# Patient Record
Sex: Female | Born: 1935 | Race: White | Hispanic: No | Marital: Married | Smoking: Never smoker
Health system: Southern US, Community
[De-identification: ages and names within clinical notes are randomized; demographics above are authoritative.]

## PROBLEM LIST (undated history)

## (undated) DIAGNOSIS — I10 Essential (primary) hypertension: Secondary | ICD-10-CM

## (undated) DIAGNOSIS — G459 Transient cerebral ischemic attack, unspecified: Secondary | ICD-10-CM

## (undated) DIAGNOSIS — F419 Anxiety disorder, unspecified: Secondary | ICD-10-CM

## (undated) DIAGNOSIS — R7881 Bacteremia: Secondary | ICD-10-CM

## (undated) DIAGNOSIS — E119 Type 2 diabetes mellitus without complications: Secondary | ICD-10-CM

## (undated) DIAGNOSIS — M1990 Unspecified osteoarthritis, unspecified site: Secondary | ICD-10-CM

## (undated) DIAGNOSIS — N390 Urinary tract infection, site not specified: Secondary | ICD-10-CM

## (undated) DIAGNOSIS — A0472 Enterocolitis due to Clostridium difficile, not specified as recurrent: Secondary | ICD-10-CM

## (undated) DIAGNOSIS — N200 Calculus of kidney: Secondary | ICD-10-CM

## (undated) HISTORY — PX: TONSILLECTOMY: SUR1361

## (undated) HISTORY — PX: PACEMAKER INSERTION: SHX728

---

## 2012-07-18 ENCOUNTER — Encounter (HOSPITAL_COMMUNITY): Payer: Self-pay | Admitting: *Deleted

## 2012-07-18 ENCOUNTER — Emergency Department (HOSPITAL_COMMUNITY): Payer: Medicare HMO

## 2012-07-18 ENCOUNTER — Inpatient Hospital Stay (HOSPITAL_COMMUNITY)
Admission: EM | Admit: 2012-07-18 | Discharge: 2012-07-21 | DRG: 690 | Disposition: A | Discharge status: Skilled Nursing Facility | Payer: Medicare HMO | Attending: Internal Medicine | Admitting: Internal Medicine

## 2012-07-18 DIAGNOSIS — G459 Transient cerebral ischemic attack, unspecified: Secondary | ICD-10-CM

## 2012-07-18 DIAGNOSIS — I639 Cerebral infarction, unspecified: Secondary | ICD-10-CM

## 2012-07-18 DIAGNOSIS — L0390 Cellulitis, unspecified: Secondary | ICD-10-CM

## 2012-07-18 DIAGNOSIS — R269 Unspecified abnormalities of gait and mobility: Secondary | ICD-10-CM | POA: Diagnosis present

## 2012-07-18 DIAGNOSIS — IMO0002 Reserved for concepts with insufficient information to code with codable children: Secondary | ICD-10-CM | POA: Diagnosis present

## 2012-07-18 DIAGNOSIS — R4701 Aphasia: Secondary | ICD-10-CM | POA: Diagnosis present

## 2012-07-18 DIAGNOSIS — E119 Type 2 diabetes mellitus without complications: Secondary | ICD-10-CM

## 2012-07-18 DIAGNOSIS — N390 Urinary tract infection, site not specified: Secondary | ICD-10-CM

## 2012-07-18 DIAGNOSIS — A498 Other bacterial infections of unspecified site: Secondary | ICD-10-CM | POA: Diagnosis present

## 2012-07-18 DIAGNOSIS — M1990 Unspecified osteoarthritis, unspecified site: Secondary | ICD-10-CM | POA: Diagnosis present

## 2012-07-18 DIAGNOSIS — I10 Essential (primary) hypertension: Secondary | ICD-10-CM | POA: Diagnosis present

## 2012-07-18 DIAGNOSIS — W010XXA Fall on same level from slipping, tripping and stumbling without subsequent striking against object, initial encounter: Secondary | ICD-10-CM | POA: Diagnosis present

## 2012-07-18 DIAGNOSIS — L02419 Cutaneous abscess of limb, unspecified: Secondary | ICD-10-CM | POA: Diagnosis present

## 2012-07-18 DIAGNOSIS — R5381 Other malaise: Secondary | ICD-10-CM | POA: Diagnosis present

## 2012-07-18 HISTORY — DX: Unspecified osteoarthritis, unspecified site: M19.90

## 2012-07-18 HISTORY — DX: Type 2 diabetes mellitus without complications: E11.9

## 2012-07-18 HISTORY — DX: Urinary tract infection, site not specified: N39.0

## 2012-07-18 LAB — TROPONIN I: Troponin I: <0.3 ng/mL (ref <0.30)

## 2012-07-18 LAB — COMPREHENSIVE METABOLIC PANEL WITH GFR
ALT: 26 U/L (ref 0–35)
AST: 34 U/L (ref 0–37)
Albumin: 3.3 g/dL — ABNORMAL LOW (ref 3.5–5.2)
Alkaline Phosphatase: 112 U/L (ref 39–117)
BUN: 26 mg/dL — ABNORMAL HIGH (ref 6–23)
CO2: 30 meq/L (ref 19–32)
Calcium: 9.8 mg/dL (ref 8.4–10.5)
Chloride: 99 meq/L (ref 96–112)
Creatinine, Ser: 0.57 mg/dL (ref 0.50–1.10)
GFR calc Af Amer: >90 mL/min
GFR calc non Af Amer: 87 mL/min — ABNORMAL LOW
Glucose, Bld: 163 mg/dL — ABNORMAL HIGH (ref 70–99)
Potassium: 3.8 meq/L (ref 3.5–5.1)
Sodium: 137 meq/L (ref 135–145)
Total Bilirubin: 0.4 mg/dL (ref 0.3–1.2)
Total Protein: 6.3 g/dL (ref 6.0–8.3)

## 2012-07-18 LAB — CBC WITH DIFFERENTIAL/PLATELET
Basophils Absolute: 0 K/uL (ref 0.0–0.1)
Basophils Relative: 0 % (ref 0–1)
Eosinophils Absolute: 0 K/uL (ref 0.0–0.7)
Eosinophils Relative: 1 % (ref 0–5)
HCT: 35.9 % — ABNORMAL LOW (ref 36.0–46.0)
Hemoglobin: 11.9 g/dL — ABNORMAL LOW (ref 12.0–15.0)
Lymphocytes Relative: 23 % (ref 12–46)
Lymphs Abs: 1.3 K/uL (ref 0.7–4.0)
MCH: 30 pg (ref 26.0–34.0)
MCHC: 33.1 g/dL (ref 30.0–36.0)
MCV: 90.4 fL (ref 78.0–100.0)
Monocytes Absolute: 0.5 K/uL (ref 0.1–1.0)
Monocytes Relative: 9 % (ref 3–12)
Neutro Abs: 4 K/uL (ref 1.7–7.7)
Neutrophils Relative %: 67 % (ref 43–77)
Platelets: 243 K/uL (ref 150–400)
RBC: 3.97 MIL/uL (ref 3.87–5.11)
RDW: 13.9 % (ref 11.5–15.5)
WBC: 5.9 K/uL (ref 4.0–10.5)

## 2012-07-18 LAB — URINALYSIS, ROUTINE W REFLEX MICROSCOPIC
Bilirubin Urine: NEGATIVE
Glucose, UA: NEGATIVE mg/dL
Nitrite: NEGATIVE
Protein, ur: 30 mg/dL — AB
Specific Gravity, Urine: 1.02 (ref 1.005–1.030)
Urobilinogen, UA: 0.2 mg/dL (ref 0.0–1.0)
pH: 8 (ref 5.0–8.0)

## 2012-07-18 LAB — URINE MICROSCOPIC-ADD ON

## 2012-07-18 LAB — GLUCOSE, CAPILLARY

## 2012-07-18 MED ORDER — VANCOMYCIN HCL IN DEXTROSE 1-5 GM/200ML-% IV SOLN
1000.0000 mg | Freq: Two times a day (BID) | INTRAVENOUS | Status: DC
  Administered 2012-07-18 – 2012-07-20 (×4): 1000 mg via INTRAVENOUS
  Filled 2012-07-18 (×6): qty 200

## 2012-07-18 MED ORDER — INSULIN ASPART 100 UNIT/ML ~~LOC~~ SOLN
0.0000 [IU] | Freq: Three times a day (TID) | SUBCUTANEOUS | Status: DC
  Administered 2012-07-19: 1 [IU] via SUBCUTANEOUS
  Administered 2012-07-19: 2 [IU] via SUBCUTANEOUS
  Administered 2012-07-20: 3 [IU] via SUBCUTANEOUS
  Administered 2012-07-20 – 2012-07-21 (×3): 1 [IU] via SUBCUTANEOUS
  Administered 2012-07-21: 2 [IU] via SUBCUTANEOUS

## 2012-07-18 MED ORDER — DEXTROSE 5 % IV SOLN
1.0000 g | INTRAVENOUS | Status: DC
  Administered 2012-07-18 – 2012-07-19 (×2): 1 g via INTRAVENOUS
  Filled 2012-07-18 (×3): qty 10

## 2012-07-18 MED ORDER — VANCOMYCIN HCL IN DEXTROSE 1-5 GM/200ML-% IV SOLN
INTRAVENOUS | Status: AC
  Filled 2012-07-18: qty 200

## 2012-07-18 MED ORDER — LORAZEPAM 2 MG/ML IJ SOLN
2.0000 mg | INTRAMUSCULAR | Status: DC | PRN
  Administered 2012-07-19: 2 mg via INTRAVENOUS
  Filled 2012-07-18 (×2): qty 1

## 2012-07-18 MED ORDER — ACETAMINOPHEN 325 MG PO TABS: 650.0000 mg | ORAL_TABLET | ORAL | Status: DC | PRN

## 2012-07-18 MED ORDER — OXYBUTYNIN CHLORIDE ER 5 MG PO TB24
15.0000 mg | ORAL_TABLET | Freq: Every evening | ORAL | Status: DC
  Administered 2012-07-18 – 2012-07-20 (×3): 15 mg via ORAL
  Filled 2012-07-18: qty 1
  Filled 2012-07-18 (×2): qty 3

## 2012-07-18 MED ORDER — ASPIRIN 325 MG PO TABS
325.0000 mg | ORAL_TABLET | Freq: Every day | ORAL | Status: DC
  Administered 2012-07-18 – 2012-07-21 (×4): 325 mg via ORAL
  Filled 2012-07-18 (×4): qty 1

## 2012-07-18 MED ORDER — DEXTROSE 5 % IV SOLN
INTRAVENOUS | Status: AC
  Filled 2012-07-18: qty 10

## 2012-07-18 MED ORDER — OXYBUTYNIN CHLORIDE ER 5 MG PO TB24
ORAL_TABLET | ORAL | Status: AC
  Filled 2012-07-18: qty 3

## 2012-07-18 MED ORDER — ENOXAPARIN SODIUM 40 MG/0.4ML ~~LOC~~ SOLN
40.0000 mg | SUBCUTANEOUS | Status: DC
  Administered 2012-07-18 – 2012-07-20 (×3): 40 mg via SUBCUTANEOUS
  Filled 2012-07-18 (×3): qty 0.4

## 2012-07-18 NOTE — ED Notes (Signed)
Got up to go to bathroom Monday at 0200 and fell.  Noticed slurred speech when sons came to help get her up.  Patient states at the time, she was having "difficulty getting her thoughts out." Family states speech has stayed the same since then. Has been unable to get up and walk since this episode.  No facial droop noted. No noticeable slurring of speech.  Also has been experiencing urinary frequency and urgency. No dysuria.

## 2012-07-18 NOTE — Progress Notes (Signed)
ANTIBIOTIC CONSULT NOTE - INITIAL  Pharmacy Consult for Vancomycin Indication: Cellulitis, left lower extremity  No Known Allergies  Patient Measurements: Height: 5\' 5"  (165.1 cm) Weight: 190 lb (86.183 kg) IBW/kg (Calculated) : 57   Vital Signs: Temp: 98 F (36.7 C) (07/08 1700) BP: 132/52 mmHg (07/08 1805) Pulse Rate: 62 (07/08 1805) Intake/Output from previous day:   Intake/Output from this shift:    Labs:  Recent Labs  07/18/12 1629  WBC 5.9  HGB 11.9*  PLT 243  CREATININE 0.57   Estimated Creatinine Clearance: 62.9 ml/min (by C-G formula based on Cr of 0.57). No results found for this basename: VANCOTROUGH, VANCOPEAK, VANCORANDOM, GENTTROUGH, GENTPEAK, GENTRANDOM, TOBRATROUGH, TOBRAPEAK, TOBRARND, AMIKACINPEAK, AMIKACINTROU, AMIKACIN,  in the last 72 hours   Microbiology: No results found for this or any previous visit (from the past 720 hour(s)).  Medical History: Past Medical History  Diagnosis Date  . Diabetes mellitus without complication   . Arthritis     Medications:  Scheduled:  . aspirin  325 mg Oral Daily  . cefTRIAXone (ROCEPHIN)  IV  1 g Intravenous Q24H  . enoxaparin (LOVENOX) injection  40 mg Subcutaneous Q24H  . [START ON 07/19/2012] insulin aspart  0-9 Units Subcutaneous TID WC  . oxybutynin  15 mg Oral QPM  . vancomycin  1,000 mg Intravenous Q12H   Assessment: Labs reviewed Cellulitis  Goal of Therapy:  Vancomycin trough level 10-15 mcg/ml  Plan:  Vancomycin 1 GM IV every 12 hours Monitor renal function Labs per protocol  Nicole Williamson, Nicole Williamson 07/18/2012,8:41 PM

## 2012-07-18 NOTE — ED Provider Notes (Signed)
History  This chart was scribed for Nicole Lennert, MD, by Candelaria Stagers, ED Scribe. This patient was seen in room APA11/AP[facility]11 and the patient's care was started at 4:15 PM  CSN: [id number] Arrival date & time 07/18/12  1513  First MD Initiated Contact with Patient 07/18/12 1611     Chief Complaint  Patient presents with  . Aphasia    Patient is a 79 y.o. female presenting with fall. The history is provided by the patient and a relative. No language interpreter was used.  Fall This is a new problem. The current episode started 2 days ago. The problem occurs constantly. The problem has not changed since onset.Associated symptoms comments: Aphasia, difficulty walking. Nothing aggravates the symptoms. Nothing relieves the symptoms. She has tried nothing for the symptoms. The treatment provided no relief.   HPI Comments: Nicole Williamson is a 79 y.o. female who presents to the Emergency Department complaining of aphasia that started yesterday after she reports she tripped and fell while going to the bathroom.  Pt has not been walking without support since the fall due to unsteadiness that became worse after the fall.  Her family reports she walked with a walker before the fall.  She has an abrasion to the left lower anterior leg.    PCP in [place], [place]  Past Medical History  Diagnosis Date  . Diabetes mellitus without complication   . Arthritis    Past Surgical History  Procedure Laterality Date  . Tonsillectomy     History reviewed. No pertinent family history. History  Substance Use Topics  . Smoking status: Never Smoker   . Smokeless tobacco: Not on file  . Alcohol Use: No   OB History   Grav Para Term Preterm Abortions TAB SAB Ect Mult Living                 Review of Systems  Neurological: Positive for speech difficulty and weakness.  All other systems reviewed and are negative.    Allergies  Review of patient's allergies indicates no known allergies.  Home  Medications  No current outpatient prescriptions on file. BP 112/45  Pulse 85  Temp(Src) 97.8 F (36.6 C)  Resp 18  Ht 5\' 5"  (1.651 m)  Wt 190 lb (86.183 kg)  BMI 31.62 kg/m2  SpO2 96% Physical Exam  Nursing note and vitals reviewed. Constitutional: She is oriented to person, place, and time. She appears well-developed and well-nourished. No distress.  HENT:  Head: Normocephalic and atraumatic.  Eyes: EOM are normal.  Neck: Neck supple. No tracheal deviation present.  Cardiovascular: Normal rate.   Pulmonary/Chest: Effort normal. No respiratory distress.  Musculoskeletal: Normal range of motion.  Neurological: She is alert and oriented to person, place, and time.  Minimal slurred speech.  Profound weakness in bilateral legs.   Skin: Skin is warm and dry.  Psychiatric: She has a normal mood and affect. Her behavior is normal.    ED Course  Procedures  DIAGNOSTIC STUDIES: Oxygen Saturation is 96% on room air, normal by my interpretation.    COORDINATION OF CARE:  4:17 PM Discussed course of care with pt.  Pt understands and agrees.   7:09 PM Recheck: Sx unchanged.  Discussed labs and images with pt and family and need for hospital admission.    Labs Reviewed  CBC WITH DIFFERENTIAL - Abnormal; Notable for the following:    Hemoglobin 11.9 (*)    HCT 35.9 (*)    All other components within  normal limits  COMPREHENSIVE METABOLIC PANEL - Abnormal; Notable for the following:    Glucose, Bld 163 (*)    BUN 26 (*)    Albumin 3.3 (*)    GFR calc non Af Amer 87 (*)    All other components within normal limits  URINALYSIS, ROUTINE W REFLEX MICROSCOPIC - Abnormal; Notable for the following:    APPearance TURBID (*)    Hgb urine dipstick SMALL (*)    Ketones, ur TRACE (*)    Protein, ur 30 (*)    Leukocytes, UA MODERATE (*)    All other components within normal limits  URINE MICROSCOPIC-ADD ON - Abnormal; Notable for the following:    Squamous Epithelial / LPF FEW (*)     Bacteria, UA MANY (*)    All other components within normal limits  URINE CULTURE  TROPONIN I   Dg Pelvis 1-2 Views  07/18/2012   *RADIOLOGY REPORT*  Clinical Data: Fall.  Weakness.  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  I see no acute fracture, subluxation or dislocation.  SI joints and hip joints are symmetric and unremarkable.  Rightward scoliosis and degenerative changes in the visualized lower lumbar spine.  IMPRESSION: No acute bony abnormality.   Original Report Authenticated By: Charlett Nose, M.D.   Ct Head Wo Contrast  07/18/2012   *RADIOLOGY REPORT*  Clinical Data:  aphasia  CT HEAD WITHOUT CONTRAST CT CERVICAL SPINE WITHOUT CONTRAST  Technique:  Multidetector CT imaging of the head and cervical spine was performed following the standard protocol without intravenous contrast.  Multiplanar CT image reconstructions of the cervical spine were also generated.  Comparison:   None  CT HEAD  Findings: There is diffuse patchy low density throughout the subcortical and periventricular white matter consistent with chronic small vessel ischemic change.  There is prominence of the sulci and ventricles consistent with brain atrophy.  The paranasal sinuses and the mastoid air cells are clear.  The skull is intact.  IMPRESSION:  1.  Small vessel ischemic change and brain atrophy.  CT CERVICAL SPINE  Findings: Normal alignment of the cervical spine.  The vertebral body heights and disc spaces are well preserved.  No fracture or subluxation identified.Lung apices appear clear.  IMPRESSION:  No acute findings.   Original Report Authenticated By: Signa Kell, M.D.   Ct Cervical Spine Wo Contrast  07/18/2012   *RADIOLOGY REPORT*  Clinical Data:  aphasia  CT HEAD WITHOUT CONTRAST CT CERVICAL SPINE WITHOUT CONTRAST  Technique:  Multidetector CT imaging of the head and cervical spine  was performed following the standard protocol without intravenous contrast.  Multiplanar CT image reconstructions of the cervical spine were also generated.  Comparison:   None  CT HEAD  Findings: There is diffuse patchy low density throughout the subcortical and periventricular white matter consistent with chronic small vessel ischemic change.  There is prominence of the sulci and ventricles consistent with brain atrophy.  The paranasal sinuses and the mastoid air cells are clear.  The skull is intact.  IMPRESSION:  1.  Small vessel ischemic change and brain atrophy.  CT CERVICAL SPINE  Findings: Normal alignment of the cervical spine.  The vertebral body heights and disc spaces are well preserved.  No fracture or subluxation identified.Lung apices appear clear.  IMPRESSION:  No acute findings.   Original Report Authenticated By: Signa Kell, M.D.  No diagnosis found.  MDM   Uti,  Possible stroke.          The chart was scribed for me under my direct supervision.  I personally performed the history, physical, and medical decision making and all procedures in the evaluation of this patient.Nicole Lennert, MD 07/18/12 [phone]

## 2012-07-18 NOTE — H&P (Signed)
PCP:   No primary provider on file.   Chief Complaint:  Weakness after fall  HPI: 79 year old female who came to the ED today after patient was having difficulty walking and also difficulty finding words. Patient's symptoms started 2 days ago when she fell and had difficulty walking since then. As per patient she was noticed to have dragging her left leg, and also had some aphasia and difficulty finding words. She does not have a history of stroke in the past, patient was being treated for UTI. At this time most of her symptoms are resolved. She denies seizure, no chest pain shortness of breath, no nausea vomiting or diarrhea. She denies fever but admits to having some urgency and increased frequency of urination Allergies:  No Known Allergies    Past Medical History  Diagnosis Date  . Diabetes mellitus without complication   . Arthritis     Past Surgical History  Procedure Laterality Date  . Tonsillectomy      Prior to Admission medications   Medication Sig Start Date End Date Taking? Authorizing Provider  aspirin EC 81 MG tablet Take 81 mg by mouth daily.   Yes Historical Provider, MD  calcium carbonate (TUMS - DOSED IN MG ELEMENTAL CALCIUM) 500 MG chewable tablet Chew 1 tablet by mouth daily as needed for heartburn.   Yes Historical Provider, MD  lisinopril (PRINIVIL,ZESTRIL) 10 MG tablet Take 10 mg by mouth daily.   Yes Historical Provider, MD  oxybutynin (DITROPAN XL) 15 MG 24 hr tablet Take 15 mg by mouth every evening.   Yes Historical Provider, MD  pioglitazone (ACTOS) 30 MG tablet Take 30 mg by mouth every evening. Taken with evening meal   Yes Historical Provider, MD    Social History:  reports that she has never smoked. She does not have any smokeless tobacco history on file. She reports that she does not drink alcohol or use illicit drugs.  History reviewed. No pertinent family history.   All the positives are listed in BOLD  Review of Systems:  HEENT: Headache,  blurred vision, runny nose, sore throat Neck: Hypothyroidism, hyperthyroidism,,lymphadenopathy Chest : Shortness of breath, history of COPD, Asthma Heart : Chest pain, history of coronary arterey disease GI:  Nausea, vomiting, diarrhea, constipation, GERD GU: Dysuria, urgency, frequency of urination, hematuria Neuro: Stroke, seizures, syncope Psych: Depression, anxiety, hallucinations   Physical Exam: Blood pressure 132/52, pulse 62, temperature 98 F (36.7 C), resp. rate 18, height 5\' 5"  (1.651 m), weight 86.183 kg (190 lb), SpO2 99.00%. Constitutional:   Patient is a well-developed and well-nourished female* in no acute distress and cooperative with exam. Head: Normocephalic and atraumatic Mouth: Mucus membranes moist Eyes: PERRL, EOMI, conjunctivae normal Neck: Supple, No Thyromegaly Cardiovascular: RRR, S1 normal, S2 normal Pulmonary/Chest: CTAB, no wheezes, rales, or rhonchi Abdominal: Soft. Non-tender, non-distended, bowel sounds are normal, no masses, organomegaly, or guarding present.  Neurological: A&O x3, Strenght is normal and symmetric bilaterally, cranial nerve II-XII are grossly intact, no focal motor deficit, sensory intact to light touch bilaterally.  Extremities : Positive erythema, warmth noted in the left lower extremity, small skin breakdown noted in the lateral aspect of the left lower extremity   Labs on Admission:  Results for orders placed during the hospital encounter of 07/18/12 (from the past 48 hour(s))  CBC WITH DIFFERENTIAL     Status: Abnormal   Collection Time    07/18/12  4:29 PM      Result Value Range   WBC 5.9  4.0 - 10.5 K/uL   RBC 3.97  3.87 - 5.11 MIL/uL   Hemoglobin 11.9 (*) 12.0 - 15.0 g/dL   HCT 16.1 (*) 09.6 - 04.5 %   MCV 90.4  78.0 - 100.0 fL   MCH 30.0  26.0 - 34.0 pg   MCHC 33.1  30.0 - 36.0 g/dL   RDW 40.9  81.1 - 91.4 %   Platelets 243  150 - 400 K/uL   Neutrophils Relative % 67  43 - 77 %   Neutro Abs 4.0  1.7 - 7.7 K/uL    Lymphocytes Relative 23  12 - 46 %   Lymphs Abs 1.3  0.7 - 4.0 K/uL   Monocytes Relative 9  3 - 12 %   Monocytes Absolute 0.5  0.1 - 1.0 K/uL   Eosinophils Relative 1  0 - 5 %   Eosinophils Absolute 0.0  0.0 - 0.7 K/uL   Basophils Relative 0  0 - 1 %   Basophils Absolute 0.0  0.0 - 0.1 K/uL  COMPREHENSIVE METABOLIC PANEL     Status: Abnormal   Collection Time    07/18/12  4:29 PM      Result Value Range   Sodium 137  135 - 145 mEq/L   Potassium 3.8  3.5 - 5.1 mEq/L   Chloride 99  96 - 112 mEq/L   CO2 30  19 - 32 mEq/L   Glucose, Bld 163 (*) 70 - 99 mg/dL   BUN 26 (*) 6 - 23 mg/dL   Creatinine, Ser 7.82  0.50 - 1.10 mg/dL   Calcium 9.8  8.4 - 95.6 mg/dL   Total Protein 6.3  6.0 - 8.3 g/dL   Albumin 3.3 (*) 3.5 - 5.2 g/dL   AST 34  0 - 37 U/L   ALT 26  0 - 35 U/L   Alkaline Phosphatase 112  39 - 117 U/L   Total Bilirubin 0.4  0.3 - 1.2 mg/dL   GFR calc non Af Amer 87 (*) >90 mL/min   GFR calc Af Amer >90  >90 mL/min   Comment:            The eGFR has been calculated     using the CKD EPI equation.     This calculation has not been     validated in all clinical     situations.     eGFR's persistently     <90 mL/min signify     possible Chronic Kidney Disease.  TROPONIN I     Status: None   Collection Time    07/18/12  4:29 PM      Result Value Range   Troponin I <0.30  <0.30 ng/mL   Comment:            Due to the release kinetics of cTnI,     a negative result within the first hours     of the onset of symptoms does not rule out     myocardial infarction with certainty.     If myocardial infarction is still suspected,     repeat the test at appropriate intervals.  URINALYSIS, ROUTINE W REFLEX MICROSCOPIC     Status: Abnormal   Collection Time    07/18/12  5:00 PM      Result Value Range   Color, Urine YELLOW  YELLOW   APPearance TURBID (*) CLEAR   Specific Gravity, Urine 1.020  1.005 - 1.030   pH 8.0  5.0 -  8.0   Glucose, UA NEGATIVE  NEGATIVE mg/dL   Hgb urine  dipstick SMALL (*) NEGATIVE   Bilirubin Urine NEGATIVE  NEGATIVE   Ketones, ur TRACE (*) NEGATIVE mg/dL   Protein, ur 30 (*) NEGATIVE mg/dL   Urobilinogen, UA 0.2  0.0 - 1.0 mg/dL   Nitrite NEGATIVE  NEGATIVE   Leukocytes, UA MODERATE (*) NEGATIVE  URINE MICROSCOPIC-ADD ON     Status: Abnormal   Collection Time    07/18/12  5:00 PM      Result Value Range   Squamous Epithelial / LPF FEW (*) RARE   WBC, UA TOO NUMEROUS TO COUNT  <3 WBC/hpf   Bacteria, UA MANY (*) RARE    Radiological Exams on Admission: Dg Pelvis 1-2 Views  07/18/2012   *RADIOLOGY REPORT*  Clinical Data: Fall.  Weakness.  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  I see no acute fracture, subluxation or dislocation.  SI joints and hip joints are symmetric and unremarkable.  Rightward scoliosis and degenerative changes in the visualized lower lumbar spine.  IMPRESSION: No acute bony abnormality.   Original Report Authenticated By: Charlett Nose, M.D.   Ct Head Wo Contrast  07/18/2012   *RADIOLOGY REPORT*  Clinical Data:  aphasia  CT HEAD WITHOUT CONTRAST CT CERVICAL SPINE WITHOUT CONTRAST  Technique:  Multidetector CT imaging of the head and cervical spine was performed following the standard protocol without intravenous contrast.  Multiplanar CT image reconstructions of the cervical spine were also generated.  Comparison:   None  CT HEAD  Findings: There is diffuse patchy low density throughout the subcortical and periventricular white matter consistent with chronic small vessel ischemic change.  There is prominence of the sulci and ventricles consistent with brain atrophy.  The paranasal sinuses and the mastoid air cells are clear.  The skull is intact.  IMPRESSION:  1.  Small vessel ischemic change and brain atrophy.  CT CERVICAL SPINE  Findings: Normal alignment of the cervical spine.  The vertebral  body heights and disc spaces are well preserved.  No fracture or subluxation identified.Lung apices appear clear.  IMPRESSION:  No acute findings.   Original Report Authenticated By: Signa Kell, M.D.   Ct Cervical Spine Wo Contrast  07/18/2012   *RADIOLOGY REPORT*  Clinical Data:  aphasia  CT HEAD WITHOUT CONTRAST CT CERVICAL SPINE WITHOUT CONTRAST  Technique:  Multidetector CT imaging of the head and cervical spine was performed following the standard protocol without intravenous contrast.  Multiplanar CT image reconstructions of the cervical spine were also generated.  Comparison:   None  CT HEAD  Findings: There is diffuse patchy low density throughout the subcortical and periventricular white matter consistent with chronic small vessel ischemic change.  There is prominence of the sulci and ventricles consistent with brain atrophy.  The paranasal sinuses and the mastoid air cells are clear.  The skull is intact.  IMPRESSION:  1.  Small vessel ischemic change and brain atrophy.  CT CERVICAL SPINE  Findings: Normal alignment of the cervical spine.  The vertebral body heights and disc spaces are well preserved.  No fracture or subluxation identified.Lung apices appear clear.  IMPRESSION:  No acute findings.   Original Report Authenticated By: Signa Kell, M.D.    Assessment/Plan Active Problems:   TIA (transient ischemic attack)   UTI (lower urinary tract infection)   Cellulitis  Diabetes mellitus  TIA Patient had symptoms of weakness and aphasia which have now resolved Will admit the patient for workup for TIA versus stroke We'll obtain MRA of the brain We'll give patient Ativan when necessary for claustrophobia during the MRI Obtain hemoglobin A1c, lipid panel in the morning  UTI Patient's UA is abnormal We'll start Rocephin Await urine culture results  Cellulitis Patient has left lower extremity cellulitis We'll start vancomycin per pharmacy consult Once patient starts improving  she can be switched over to by mouth antibiotics  Diabetes mellitus We'll start sliding scale insulin Will hold the pioglitazone at this time  Hypertension Hold lisinopril for permissive hypertension  Weakness Will get PT OT consult   Code status: Full code  Family discussion: Discussed with daughter-in-law at bedside   Time Spent on Admission: 60 min  Malaysha Arlen S [facility] Pager: [phone] 07/18/2012, 8:10 PM  If 7PM-7AM, please contact night-coverage  [email or web address]  Password [email or web address]

## 2012-07-19 ENCOUNTER — Inpatient Hospital Stay (HOSPITAL_COMMUNITY): Payer: Medicare HMO

## 2012-07-19 DIAGNOSIS — I517 Cardiomegaly: Secondary | ICD-10-CM

## 2012-07-19 LAB — RAPID URINE DRUG SCREEN, HOSP PERFORMED
Amphetamines: NOT DETECTED
Benzodiazepines: NOT DETECTED
Opiates: NOT DETECTED
Tetrahydrocannabinol: NOT DETECTED

## 2012-07-19 LAB — GLUCOSE, CAPILLARY
Glucose-Capillary: 132 mg/dL — ABNORMAL HIGH (ref 70–99)
Glucose-Capillary: 150 mg/dL — ABNORMAL HIGH (ref 70–99)

## 2012-07-19 LAB — LIPID PANEL
HDL: 61 mg/dL (ref >39)
Total CHOL/HDL Ratio: 2.3 RATIO

## 2012-07-19 LAB — HEMOGLOBIN A1C: Mean Plasma Glucose: 137 mg/dL — ABNORMAL HIGH (ref <117)

## 2012-07-19 NOTE — Care Management Note (Signed)
    Page 1 of 1   07/21/2012     12:03:17 PM   CARE MANAGEMENT NOTE 07/21/2012  Patient:  Nicole Williamson, Nicole Williamson   Account Number:  [id number]  Date Initiated:  07/19/2012  Documentation initiated by:  Rosemary Holms  Subjective/Objective Assessment:   Pt admitted from home where she lives with spouse. States since her fall she has not been able to stand. Notices her speech is slurred and has a problem finding the right words.     Action/Plan:   Spoke about HH versus placement for rehab. Concerned over cost.   Anticipated DC Date:  07/21/2012   Anticipated DC Plan:  SKILLED NURSING FACILITY      DC Planning Services  CM consult      Choice offered to / List presented to:             Status of service:  Completed, signed off Medicare Important Message given?   (If response is "NO", the following Medicare IM given date fields will be blank) Date Medicare IM given:   Date Additional Medicare IM given:    Discharge Disposition:  SKILLED NURSING FACILITY  Per UR Regulation:    If discussed at Long Length of Stay Meetings, dates discussed:    Comments:  07/19/12 Rosemary Holms RN BSN CM

## 2012-07-19 NOTE — Clinical Social Work Note (Signed)
Referral for advance directives. Spoke with chaplain who plans to discuss with pt today. CSW signing off but can be reconsulted if needed.   Derenda Fennel, [place] [phone]

## 2012-07-19 NOTE — Progress Notes (Signed)
OT Cancellation Note  Patient Details Name: Nicole Williamson MRN: [id number] DOB: 1931/02/27   Cancelled Treatment:    Reason Eval/Treat Not Completed: Patient declined. Attempted OT evaluation. Patient refused to participated as she was feeling extremely anxious waiting all morning to go the MRI. Will attempt OT eval this afternoon if able.   Limmie Patricia, OTR/L,CBIS   07/19/2012, 11:33 AM

## 2012-07-19 NOTE — Progress Notes (Signed)
Utilization Review Complete  

## 2012-07-19 NOTE — Progress Notes (Signed)
Nicole Williamson EXB:[id number] DOB: March 18, 1931 DOA: 07/18/2012 PCP: No primary provider on file.   Subjective: This lady fell approximately 36 hours ago at home and could not get up very easily. It seems that she was unsteady on her feet. Also her speech was slurred at the time and she could not find the words that she wanted to say. Her symptoms are somewhat improved but she still feels unsteady. She has sustained inflammation and probable infection of the site of the fall in the left lower leg. She also has a UTI. She is awaiting MRI brain scan.           Physical Exam: Blood pressure 106/61, pulse 57, temperature 98.3 F (36.8 C), temperature source Oral, resp. rate 20, height 5\' 5"  (1.651 m), weight 71.9 kg (158 lb 8.2 oz), SpO2 96.00%. She is alert and orientated. There are no obvious focal neurological signs. She does not have any obvious cerebellar signs either. She does have left lower leg cellulitis. There are no carotid bruits. She is in sinus rhythm. There are no murmurs. Lung fields are clear.   Investigations:  No results found for this or any previous visit (from the past 240 hour(s)).   Basic Metabolic Panel:  Recent Labs  11/07/23 1629  NA 137  K 3.8  CL 99  CO2 30  GLUCOSE 163*  BUN 26*  CREATININE 0.57  CALCIUM 9.8   Liver Function Tests:  Recent Labs  07/18/12 1629  AST 34  ALT 26  ALKPHOS 112  BILITOT 0.4  PROT 6.3  ALBUMIN 3.3*     CBC:  Recent Labs  07/18/12 1629  WBC 5.9  NEUTROABS 4.0  HGB 11.9*  HCT 35.9*  MCV 90.4  PLT 243    Dg Pelvis 1-2 Views  07/18/2012   *RADIOLOGY REPORT*  Clinical Data: Fall.  Weakness.  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  I see no acute fracture, subluxation or dislocation.  SI joints and hip joints are symmetric and unremarkable.  Rightward scoliosis and degenerative  changes in the visualized lower lumbar spine.  IMPRESSION: No acute bony abnormality.   Original Report Authenticated By: Charlett Nose, M.D.   Ct Head Wo Contrast  07/18/2012   *RADIOLOGY REPORT*  Clinical Data:  aphasia  CT HEAD WITHOUT CONTRAST CT CERVICAL SPINE WITHOUT CONTRAST  Technique:  Multidetector CT imaging of the head and cervical spine was performed following the standard protocol without intravenous contrast.  Multiplanar CT image reconstructions of the cervical spine were also generated.  Comparison:   None  CT HEAD  Findings: There is diffuse patchy low density throughout the subcortical and periventricular white matter consistent with chronic small vessel ischemic change.  There is prominence of the sulci and ventricles consistent with brain atrophy.  The paranasal sinuses and the mastoid air cells are clear.  The skull is intact.  IMPRESSION:  1.  Small vessel ischemic change and brain atrophy.  CT CERVICAL SPINE  Findings: Normal alignment of the cervical spine.  The vertebral body heights and disc spaces are well preserved.  No fracture or subluxation identified.Lung apices appear clear.  IMPRESSION:  No acute findings.   Original Report Authenticated By: Signa Kell, M.D.   Ct Cervical Spine Wo Contrast  07/18/2012   *RADIOLOGY REPORT*  Clinical Data:  aphasia  CT HEAD WITHOUT CONTRAST CT CERVICAL SPINE WITHOUT CONTRAST  Technique:  Multidetector CT imaging of the head and cervical spine was performed following the standard protocol without intravenous contrast.  Multiplanar CT image reconstructions of the cervical spine were also generated.  Comparison:   None  CT HEAD  Findings: There is diffuse patchy low density throughout the subcortical and periventricular white matter consistent with chronic small vessel ischemic change.  There is prominence of the sulci and ventricles consistent with brain atrophy.  The paranasal sinuses and the mastoid air cells are clear.  The skull is intact.   IMPRESSION:  1.  Small vessel ischemic change and brain atrophy.  CT CERVICAL SPINE  Findings: Normal alignment of the cervical spine.  The vertebral body heights and disc spaces are well preserved.  No fracture or subluxation identified.Lung apices appear clear.  IMPRESSION:  No acute findings.   Original Report Authenticated By: Signa Kell, M.D.      Medications: I have reviewed the patient's current medications.  Impression: 1. TIA versus CVA, possibly affecting the cerebellar area. 2. UTI. 3. Left lower leg cellulitis. 4. Diabetes mellitus.     Plan: 1. Await MRI brain scan. 2. Neurology consultation. 3. Physical therapy evaluation.  Consultants:  Await neurology consultation.   Procedures:  Echocardiogram: ------------------------------------------------------------ Study Conclusions  - Left ventricle: The cavity size was normal. There was mild concentric hypertrophy. Systolic function was vigorous. The estimated ejection fraction was in the range of 65% to 70%. Wall motion was normal; there were no regional wall motion abnormalities. Doppler parameters are consistent with abnormal left ventricular relaxation (grade 1 diastolic dysfunction). Doppler parameters are consistent with elevated mean left atrial filling pressure. - Aortic valve: Poorly visualized. Mildly calcified annulus. Mildly calcified leaflets. No significant regurgitation. - Mitral valve: Severely calcified annulus, particularly posterior.Mildly thickened leaflets, possible mild prolpase of portion of anterior leaflet - not well seen. Thickening of the subvalvular apparatus. There was no evidence for stenosis. Trivial regurgitation. Peak gradient: 3mm Hg (D). - Left atrium: The atrium was moderately dilated. - Right atrium: Central venous pressure: 8mm Hg (est). - Atrial septum: No defect or patent foramen ovale was identified based on limited images. - Tricuspid valve: Trivial  regurgitation. - Pulmonary arteries: Systolic pressure could not be accurately estimated. - Pericardium, extracardiac: There was no pericardial effusion. Impressions:  - No prior study for comparison. Mild LVH with LVEF 65-70%, grade 1 diastolic dysfunction with increased filling pressures. Moderate left atrial enlargement. Severe MAC with thickened mitral leaflets, possible mild prolapse of portion of anterior leaflet, but trivial mitral regurgitation. Aortic valve mildly calcfied but not well seen. No obvious PFO or ASD. Trivial tricuspid regurgitation.Cannot assess PASP, CVP estimated at 8 mmHg. Transthoracic echocardiography. M-mode, complete 2D, spectral Doppler, and color Doppler. Height: Height: 165.1cm. Height: 65in. Weight: Weight: 71.7kg. Weight: 157.7lb. Body mass index: BMI: 26.3kg/m^2. Body surface area: BSA: 1.42m^2. Patient status: Inpatient. Location: Bedside.   Antibiotics:  Intravenous Rocephin started 07/18/2012.  Intravenous vancomycin started 07/18/2012.                   Code Status: Full code.  Family Communication: Discussed plan with patient and husband at the bedside.   Disposition Plan: Depending on progress.  Time spent: 20 minutes.   LOS: 1 day   Wilson Singer Pager [phone]  07/19/2012, 12:17 PM

## 2012-07-19 NOTE — Progress Notes (Signed)
*  PRELIMINARY RESULTS* Echocardiogram 2D Echocardiogram has been performed.  Nicole Williamson 07/19/2012, 9:40 AM

## 2012-07-19 NOTE — Progress Notes (Signed)
Brought and discussed Advance Directives packet for patient to review due to her request.  Will follow up after she has reviewed.  Spent time also listening/discussing her anxiety about what happened to her as she waited for tests. Family was attentive and present. Prayed with her.

## 2012-07-20 LAB — BASIC METABOLIC PANEL
CO2: 28 mEq/L (ref 19–32)
Chloride: 103 mEq/L (ref 96–112)
Creatinine, Ser: 0.4 mg/dL — ABNORMAL LOW (ref 0.50–1.10)
GFR calc Af Amer: >90 mL/min (ref >90)
Potassium: 3.5 mEq/L (ref 3.5–5.1)
Sodium: 137 mEq/L (ref 135–145)

## 2012-07-20 LAB — GLUCOSE, CAPILLARY
Glucose-Capillary: 128 mg/dL — ABNORMAL HIGH (ref 70–99)
Glucose-Capillary: 153 mg/dL — ABNORMAL HIGH (ref 70–99)

## 2012-07-20 MED ORDER — LEVOFLOXACIN IN D5W 500 MG/100ML IV SOLN
500.0000 mg | INTRAVENOUS | Status: DC
  Administered 2012-07-20: 500 mg via INTRAVENOUS
  Filled 2012-07-20 (×2): qty 100

## 2012-07-20 MED ORDER — PIOGLITAZONE HCL 30 MG PO TABS
30.0000 mg | ORAL_TABLET | Freq: Every evening | ORAL | Status: DC
  Administered 2012-07-20: 30 mg via ORAL
  Filled 2012-07-20: qty 1

## 2012-07-20 MED ORDER — ALUM & MAG HYDROXIDE-SIMETH 200-200-20 MG/5ML PO SUSP
15.0000 mL | Freq: Four times a day (QID) | ORAL | Status: DC | PRN
  Administered 2012-07-20: 15 mL via ORAL
  Filled 2012-07-20: qty 30

## 2012-07-20 NOTE — Consult Note (Signed)
[facility] Nicole Williamson A. Gerilyn Pilgrim, MD     [email or web address]          Nicole Williamson is an 79 y.o. female.   ASSESSMENT/PLAN: 1. Multifactorial gait impairment. Differential diagnosis includes marked osteoarthritis, urinary tract infection and aging. Recommended physical therapy in a skilled facility.  2. Episodic spells of word finding difficulties of unclear etiology. EEG is recommended.  The patient is a 79 year old white female who apparently fell a few days ago. She did not trip over anything. She simply lost her balance. She apparently was caused from the restroom when this happened. It appears that she has had difficulties ambulating since then. She essentially has been in bed. She previously ambulated fairly well and was able to do her ADLs. The patient did not lose consciousness. She fell and sustained injuries to the left leg and also the low back area. She did not sustain any head injuries. She has had spells of word finding difficulties however. The husband also reports that the patient seemed to gotten confused while she was at home thinking that she was in the restroom. However, she was in the kitchen trying to use the restroom. The patient herself does not report focal weakness, numbness or headaches. There is no dizziness, shortness of breath or chest pain. She does complain of some left leg pain where she injured the leg during her fall.  GENERAL: This very pleasant average weight lady in no acute distress.  HEENT: Unremarkable.  ABDOMEN: soft  EXTREMITIES: No edema. There areas of superficial bruising left leg that seems to be healing well. There is rather severe arthritic changes of the knees bilaterally. There is also marked arthritic changes of the joints of the hands.  BACK: No evidence of injury or point tenderness involving the entire spine.  SKIN: Normal by inspection.    MENTAL STATUS: Alert and oriented. Speech, language and cognition are generally  intact. Judgment and insight normal.   CRANIAL NERVES: Pupils are equal, round and reactive to light and accommodation; extra ocular movements are full, there is no significant nystagmus; visual fields are full; upper and lower facial muscles are normal in strength and symmetric, there is no flattening of the nasolabial folds; tongue is midline; uvula is midline; shoulder elevation is normal.  MOTOR: There is mild weakness of deltoid 4+/5 on the right. Otherwise muscle strength is good in the upper extremities. Normal tone, bulk and strength in the legs; no pronator drift.  COORDINATION: Left finger to nose is normal, right finger to nose is normal, No rest tremor; no intention tremor; no postural tremor; no bradykinesia.  REFLEXES: Deep tendon reflexes are symmetrical and normal. Plantar responses are flexor bilaterally.   SENSATION: Normal to light touch.    Past Medical History  Diagnosis Date  . Diabetes mellitus without complication   . Arthritis     Past Surgical History  Procedure Laterality Date  . Tonsillectomy      History reviewed. No pertinent family history.  Social History:  reports that she has never smoked. She does not have any smokeless tobacco history on file. She reports that she does not drink alcohol or use illicit drugs.  Allergies: No Known Allergies  Medications: Prior to Admission medications   Medication Sig Start Date End Date Taking? Authorizing Provider  aspirin EC 81 MG tablet Take 81 mg by mouth daily.   Yes Historical Provider, MD  calcium carbonate (TUMS - DOSED IN MG ELEMENTAL CALCIUM) 500 MG chewable tablet Chew 1 tablet  by mouth daily as needed for heartburn.   Yes Historical Provider, MD  lisinopril (PRINIVIL,ZESTRIL) 10 MG tablet Take 10 mg by mouth daily.   Yes Historical Provider, MD  oxybutynin (DITROPAN XL) 15 MG 24 hr tablet Take 15 mg by mouth every evening.   Yes Historical Provider, MD  pioglitazone (ACTOS) 30 MG tablet Take 30 mg  by mouth every evening. Taken with evening meal   Yes Historical Provider, MD    Scheduled Meds: . aspirin  325 mg Oral Daily  . enoxaparin (LOVENOX) injection  40 mg Subcutaneous Q24H  . insulin aspart  0-9 Units Subcutaneous TID WC  . levofloxacin (LEVAQUIN) IV  500 mg Intravenous Q24H  . oxybutynin  15 mg Oral QPM  . pioglitazone  30 mg Oral QPM   Continuous Infusions:  PRN Meds:.acetaminophen, alum & mag hydroxide-simeth, LORazepam  Blood pressure 117/68, pulse 67, temperature 98.6 F (37 C), temperature source Oral, resp. rate 20, height 5\' 5"  (1.651 m), weight 71.9 kg (158 lb 8.2 oz), SpO2 97.00%.   Results for orders placed during the hospital encounter of 07/18/12 (from the past 48 hour(s))  HEMOGLOBIN A1C     Status: Abnormal   Collection Time    07/18/12  8:25 PM      Result Value Range   Hemoglobin A1C 6.4 (*) <5.7 %   Comment: (NOTE)                                                                               According to the ADA Clinical Practice Recommendations for 2011, when     HbA1c is used as a screening test:      >=6.5%   Diagnostic of Diabetes Mellitus               (if abnormal result is confirmed)     5.7-6.4%   Increased risk of developing Diabetes Mellitus     References:Diagnosis and Classification of Diabetes Mellitus,Diabetes     Care,2011,34(Suppl 1):S62-S69 and Standards of Medical Care in             Diabetes - 2011,Diabetes Care,2011,34 (Suppl 1):S11-S61.   Mean Plasma Glucose 137 (*) <117 mg/dL  GLUCOSE, CAPILLARY     Status: Abnormal   Collection Time    07/18/12  9:55 PM      Result Value Range   Glucose-Capillary 127 (*) 70 - 99 mg/dL   Comment 1 Notify RN     Comment 2 Documented in Chart    URINE RAPID DRUG SCREEN (HOSP PERFORMED)     Status: None   Collection Time    07/19/12  1:05 AM      Result Value Range   Opiates NONE DETECTED  NONE DETECTED   Cocaine NONE DETECTED  NONE DETECTED   Benzodiazepines NONE DETECTED  NONE  DETECTED   Amphetamines NONE DETECTED  NONE DETECTED   Tetrahydrocannabinol NONE DETECTED  NONE DETECTED   Barbiturates NONE DETECTED  NONE DETECTED   Comment:            DRUG SCREEN FOR MEDICAL PURPOSES     ONLY.  IF CONFIRMATION IS NEEDED     FOR ANY PURPOSE, NOTIFY LAB  WITHIN 5 DAYS.                LOWEST DETECTABLE LIMITS     FOR URINE DRUG SCREEN     Drug Class       Cutoff (ng/mL)     Amphetamine      1000     Barbiturate      200     Benzodiazepine   200     Tricyclics       300     Opiates          300     Cocaine          300     THC              50  LIPID PANEL     Status: None   Collection Time    07/19/12  5:49 AM      Result Value Range   Cholesterol 138  0 - 200 mg/dL   Triglycerides 92  <161 mg/dL   HDL 61  >09 mg/dL   Total CHOL/HDL Ratio 2.3     VLDL 18  0 - 40 mg/dL   LDL Cholesterol 59  0 - 99 mg/dL   Comment:            Total Cholesterol/HDL:CHD Risk     Coronary Heart Disease Risk Table                         Men   Women      1/2 Average Risk   3.4   3.3      Average Risk       5.0   4.4      2 X Average Risk   9.6   7.1      3 X Average Risk  23.4   11.0                Use the calculated Patient Ratio     above and the CHD Risk Table     to determine the patient's CHD Risk.                ATP III CLASSIFICATION (LDL):      <100     mg/dL   Optimal      604-540  mg/dL   Near or Above                        Optimal      130-159  mg/dL   Borderline      981-191  mg/dL   High      >478     mg/dL   Very High  GLUCOSE, CAPILLARY     Status: Abnormal   Collection Time    07/19/12  7:19 AM      Result Value Range   Glucose-Capillary 118 (*) 70 - 99 mg/dL   Comment 1 Documented in Chart     Comment 2 Notify RN    GLUCOSE, CAPILLARY     Status: Abnormal   Collection Time    07/19/12 11:24 AM      Result Value Range   Glucose-Capillary 174 (*) 70 - 99 mg/dL   Comment 1 Documented in Chart     Comment 2 Notify RN    GLUCOSE, CAPILLARY      Status: Abnormal   Collection Time    07/19/12  6:15  PM      Result Value Range   Glucose-Capillary 132 (*) 70 - 99 mg/dL  GLUCOSE, CAPILLARY     Status: Abnormal   Collection Time    07/19/12  9:15 PM      Result Value Range   Glucose-Capillary 150 (*) 70 - 99 mg/dL   Comment 1 Notify RN     Comment 2 Documented in Chart    BASIC METABOLIC PANEL     Status: Abnormal   Collection Time    07/20/12  5:56 AM      Result Value Range   Sodium 137  135 - 145 mEq/L   Potassium 3.5  3.5 - 5.1 mEq/L   Chloride 103  96 - 112 mEq/L   CO2 28  19 - 32 mEq/L   Glucose, Bld 145 (*) 70 - 99 mg/dL   BUN 12  6 - 23 mg/dL   Comment: DELTA CHECK NOTED   Creatinine, Ser 0.40 (*) 0.50 - 1.10 mg/dL   Calcium 8.8  8.4 - 19.1 mg/dL   GFR calc non Af Amer >90  >90 mL/min   GFR calc Af Amer >90  >90 mL/min   Comment:            The eGFR has been calculated     using the CKD EPI equation.     This calculation has not been     validated in all clinical     situations.     eGFR's persistently     <90 mL/min signify     possible Chronic Kidney Disease.  GLUCOSE, CAPILLARY     Status: Abnormal   Collection Time    07/20/12  7:11 AM      Result Value Range   Glucose-Capillary 137 (*) 70 - 99 mg/dL   Comment 1 Documented in Chart     Comment 2 Notify RN    GLUCOSE, CAPILLARY     Status: Abnormal   Collection Time    07/20/12 11:32 AM      Result Value Range   Glucose-Capillary 204 (*) 70 - 99 mg/dL   Comment 1 Documented in Chart     Comment 2 Notify RN    GLUCOSE, CAPILLARY     Status: Abnormal   Collection Time    07/20/12  4:41 PM      Result Value Range   Glucose-Capillary 128 (*) 70 - 99 mg/dL   Comment 1 Documented in Chart     Comment 2 Notify RN      Dg Pelvis 1-2 Views  07/18/2012   *RADIOLOGY REPORT*  Clinical Data: Fall.  Weakness.  PELVIS - 1-2 VIEW  Comparison: None  Findings: Large bony density medial to the proximal right femur, likely heterotopic bone or myositis ossificans  related to prior injury.  I see no acute fracture, subluxation or dislocation.  SI joints and hip joints are symmetric and unremarkable.  Rightward scoliosis and degenerative changes in the visualized lower lumbar spine.  IMPRESSION: No acute bony abnormality.   Original Report Authenticated By: Charlett Nose, M.D.   Ct Head Wo Contrast  07/18/2012   *RADIOLOGY REPORT*  Clinical Data:  aphasia  CT HEAD WITHOUT CONTRAST CT CERVICAL SPINE WITHOUT CONTRAST  Technique:  Multidetector CT imaging of the head and cervical spine was performed following the standard protocol without intravenous contrast.  Multiplanar CT image reconstructions of the cervical spine were also generated.  Comparison:   None  CT HEAD  Findings: There  is diffuse patchy low density throughout the subcortical and periventricular white matter consistent with chronic small vessel ischemic change.  There is prominence of the sulci and ventricles consistent with brain atrophy.  The paranasal sinuses and the mastoid air cells are clear.  The skull is intact.  IMPRESSION:  1.  Small vessel ischemic change and brain atrophy.  CT CERVICAL SPINE  Findings: Normal alignment of the cervical spine.  The vertebral body heights and disc spaces are well preserved.  No fracture or subluxation identified.Lung apices appear clear.  IMPRESSION:  No acute findings.   Original Report Authenticated By: Signa Kell, M.D.   Ct Cervical Spine Wo Contrast  07/18/2012   *RADIOLOGY REPORT*  Clinical Data:  aphasia  CT HEAD WITHOUT CONTRAST CT CERVICAL SPINE WITHOUT CONTRAST  Technique:  Multidetector CT imaging of the head and cervical spine was performed following the standard protocol without intravenous contrast.  Multiplanar CT image reconstructions of the cervical spine were also generated.  Comparison:   None  CT HEAD  Findings: There is diffuse patchy low density throughout the subcortical and periventricular white matter consistent with chronic small vessel  ischemic change.  There is prominence of the sulci and ventricles consistent with brain atrophy.  The paranasal sinuses and the mastoid air cells are clear.  The skull is intact.  IMPRESSION:  1.  Small vessel ischemic change and brain atrophy.  CT CERVICAL SPINE  Findings: Normal alignment of the cervical spine.  The vertebral body heights and disc spaces are well preserved.  No fracture or subluxation identified.Lung apices appear clear.  IMPRESSION:  No acute findings.   Original Report Authenticated By: Signa Kell, M.D.   Mr [facility] Wo Contrast  07/19/2012   *RADIOLOGY REPORT*  Clinical Data:  79 year old female with difficulty walking and talking.  Transient ischemic attack versus stroke.  Comparison: Carotid Doppler ultrasound 07/19/2012.  CT without contrast 07/18/2012.  MRI HEAD WITHOUT CONTRAST  Technique: Multiplanar, multiecho pulse sequences of the brain and surrounding structures were obtained according to standard protocol without intravenous contrast.  Findings: No restricted diffusion to suggest acute infarction.  No midline shift, mass effect, evidence of mass lesion, ventriculomegaly, extra-axial collection or acute intracranial hemorrhage.  Cervicomedullary junction and pituitary are within normal limits.  Negative visualized cervical spine.  Major intracranial vascular flow voids are preserved.  Occasional chronic lacunar infarcts in the bilateral basal ganglia/external capsules.  Nonspecific additional patchy and scattered cerebral white matter T2 and FLAIR hyperintensity.  No cortical encephalomalacia.  Brainstem and cerebellum are within normal limits.  Normal bone marrow signal. Visualized orbit soft tissues are within normal limits.  Minor paranasal sinus mucosal thickening.  Grossly normal visualized internal auditory structures.  Mastoids are clear.  Trace retained secretions in the nasopharynx.  Negative scalp soft tissues.  IMPRESSION: 1. No acute intracranial abnormality. 2.  Mild  for age chronic small vessel disease. 3.  See MRA findings below.  MRA HEAD WITHOUT CONTRAST  Technique: Angiographic images of the Circle of Willis were obtained using MRA technique without  intravenous contrast.  Findings: Antegrade flow in the posterior circulation with codominant distal vertebral arteries.  Patent vertebrobasilar junction.  Normal PICA origins.  Normal AICA origins.  No basilar stenosis.  SCA and PCA origins are within normal limits.  Posterior communicating arteries are diminutive or absent.  Bilateral PCA branches are within normal limits.  Antegrade flow in both ICA siphons.  No ICA stenosis is identified.  Both ophthalmic artery origins are within  normal limits.  However, there is a right supraclinoid 2-3 mm aneurysm or infundibulum arising just beyond the right ophthalmic artery origin.  See series 103 image 28 and series 5 image 102.  Normal carotid termini.  Left ACA A1 segment is mildly dominant. Anterior communicating arteries diminutive or absent.  Visualized ACA branches are within normal limits.  Bilateral MCA M1 segments are within normal limits.  Both MCA bifurcations are patent. Questionable moderate to severe stenosis at the left MCA anterior sylvian M2 branch origin (series 103 image 31). There is normal appearing preserved distal flow signal.  Otherwise negative visualized bilateral MCA branches.  IMPRESSION: 1. Suggestion of a moderate to severe stenosis of the left MCA anterior sylvian M2 branch origin.  Preserved distal flow signal in that branch. 2.  No other intracranial stenosis identified. 3.  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  As this likely is too small to treat, surveillance MRA may be most appropriate (annual, biennial).   Original Report Authenticated By: Erskine Speed, M.D.   Mri Brain Without Contrast  07/19/2012   *RADIOLOGY REPORT*  Clinical Data:  79 year old female with difficulty walking and talking.  Transient  ischemic attack versus stroke.  Comparison: Carotid Doppler ultrasound 07/19/2012.  CT without contrast 07/18/2012.  MRI HEAD WITHOUT CONTRAST  Technique: Multiplanar, multiecho pulse sequences of the brain and surrounding structures were obtained according to standard protocol without intravenous contrast.  Findings: No restricted diffusion to suggest acute infarction.  No midline shift, mass effect, evidence of mass lesion, ventriculomegaly, extra-axial collection or acute intracranial hemorrhage.  Cervicomedullary junction and pituitary are within normal limits.  Negative visualized cervical spine.  Major intracranial vascular flow voids are preserved.  Occasional chronic lacunar infarcts in the bilateral basal ganglia/external capsules.  Nonspecific additional patchy and scattered cerebral white matter T2 and FLAIR hyperintensity.  No cortical encephalomalacia.  Brainstem and cerebellum are within normal limits.  Normal bone marrow signal. Visualized orbit soft tissues are within normal limits.  Minor paranasal sinus mucosal thickening.  Grossly normal visualized internal auditory structures.  Mastoids are clear.  Trace retained secretions in the nasopharynx.  Negative scalp soft tissues.  IMPRESSION: 1. No acute intracranial abnormality. 2.  Mild for age chronic small vessel disease. 3.  See MRA findings below.  MRA HEAD WITHOUT CONTRAST  Technique: Angiographic images of the Circle of Willis were obtained using MRA technique without  intravenous contrast.  Findings: Antegrade flow in the posterior circulation with codominant distal vertebral arteries.  Patent vertebrobasilar junction.  Normal PICA origins.  Normal AICA origins.  No basilar stenosis.  SCA and PCA origins are within normal limits.  Posterior communicating arteries are diminutive or absent.  Bilateral PCA branches are within normal limits.  Antegrade flow in both ICA siphons.  No ICA stenosis is identified.  Both ophthalmic artery origins are  within normal limits.  However, there is a right supraclinoid 2-3 mm aneurysm or infundibulum arising just beyond the right ophthalmic artery origin.  See series 103 image 28 and series 5 image 102.  Normal carotid termini.  Left ACA A1 segment is mildly dominant. Anterior communicating arteries diminutive or absent.  Visualized ACA branches are within normal limits.  Bilateral MCA M1 segments are within normal limits.  Both MCA bifurcations are patent. Questionable moderate to severe stenosis at the left MCA anterior sylvian M2 branch origin (series 103 image 31). There is normal appearing preserved distal flow signal.  Otherwise negative visualized bilateral MCA branches.  IMPRESSION: 1. Suggestion of a moderate to severe stenosis of the left MCA anterior sylvian M2 branch origin.  Preserved distal flow signal in that branch. 2.  No other intracranial stenosis identified. 3.  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  As this likely is too small to treat, surveillance MRA may be most appropriate (annual, biennial).   Original Report Authenticated By: Erskine Speed, M.D.   US Carotid Duplex Bilateral  07/19/2012   *RADIOLOGY REPORT*  Clinical Data: Transient ischemic attack  BILATERAL CAROTID DUPLEX ULTRASOUND  Technique: Wallace Cullens scale imaging, color Doppler and duplex ultrasound were performed of bilateral carotid and vertebral arteries in the neck.  Comparison:  CT head and cervical spine 07/18/2012  Criteria:  Quantification of carotid stenosis is based on velocity parameters that correlate the residual internal carotid diameter with NASCET-based stenosis levels, using the diameter of the distal internal carotid lumen as the denominator for stenosis measurement.  The following velocity measurements were obtained:                   PEAK SYSTOLIC/END DIASTOLIC RIGHT ICA:                        156/24cm/sec CCA:                        109/12cm/sec SYSTOLIC ICA/CCA RATIO:     1.4  DIASTOLIC ICA/CCA RATIO:    2.0 ECA:                        205cm/sec  LEFT ICA:                        114/13cm/sec CCA:                        101/8cm/sec SYSTOLIC ICA/CCA RATIO:     1.1 DIASTOLIC ICA/CCA RATIO:    1.8 ECA:                        156cm/sec  Findings:  RIGHT CAROTID ARTERY: Heterogeneous atherosclerotic plaque in the carotid bifurcation extending into the proximal internal and external carotid arteries.  Focal acentric heterogeneous plaque in the proximal internal carotid artery results of focally elevated peak systolic velocities, turbulence and spectral broadening.  RIGHT VERTEBRAL ARTERY:  Patent with normal antegrade flow.  LEFT CAROTID ARTERY: Mild heterogeneous atherosclerotic plaque in the carotid bulb extending into the proximal internal carotid artery.  No hemodynamically significant stenosis by gray scale, color Doppler, pulsed Doppler or spectral waveform analysis.  LEFT VERTEBRAL ARTERY:  Patent with normal antegrade flow.  IMPRESSION:  1.  Heterogeneous atherosclerotic plaque results in 50 - 69% estimated diameter stenosis in the right internal carotid artery.  2.  Heterogeneous atherosclerotic plaque results in less than 50% diameter stenosis in the left internal carotid artery.  3.  Bilateral vertebral arteries are patent with antegrade flow.  Signed,  Sterling Big, MD Vascular & Interventional Radiologist [facility]   Original Report Authenticated By: Malachy Moan, M.D.     ECHO  Study Conclusions  - Left ventricle: The cavity size was normal. There was mild concentric hypertrophy. Systolic function was vigorous. The estimated ejection fraction was in the range of 65% to 70%. Wall motion was normal; there were no regional wall motion abnormalities. Doppler parameters are  consistent with abnormal left ventricular relaxation (grade 1 diastolic dysfunction). Doppler parameters are consistent with elevated mean left atrial filling pressure. - Aortic  valve: Poorly visualized. Mildly calcified annulus. Mildly calcified leaflets. No significant regurgitation. - Mitral valve: Severely calcified annulus, particularly posterior.Mildly thickened leaflets, possible mild prolpase of portion of anterior leaflet - not well seen. Thickening of the subvalvular apparatus. There was no evidence for stenosis. Trivial regurgitation. Peak gradient: 3mm Hg (D). - Left atrium: The atrium was moderately dilated. - Right atrium: Central venous pressure: 8mm Hg (est). - Atrial septum: No defect or patent foramen ovale was identified based on limited images. - Tricuspid valve: Trivial regurgitation. - Pulmonary arteries: Systolic pressure could not be accurately estimated. - Pericardium, extracardiac: There was no pericardial effusion.    Zahki Hoogendoorn A. Gerilyn Pilgrim, M.D.  Diplomate, Biomedical engineer of Psychiatry and Neurology ( Neurology). 07/20/2012, 5:19 PM

## 2012-07-20 NOTE — Clinical Social Work Psychosocial (Signed)
Clinical Social Work Department BRIEF PSYCHOSOCIAL ASSESSMENT 07/20/2012  Patient:  Nicole Williamson, Nicole Williamson     Account Number:  [id number]     Admit date:  07/18/2012  Clinical Social Worker:  Nancie Neas  Date/Time:  07/20/2012 01:50 PM  Referred by:  Care Management  Date Referred:  07/20/2012 Referred for  SNF Placement   Other Referral:   Interview type:  Patient Other interview type:   and family    PSYCHOSOCIAL DATA Living Status:  HUSBAND Admitted from facility:   Level of care:   Primary support name:  [place] Primary support relationship to patient:  FAMILY Degree of support available:   very supportive    CURRENT CONCERNS Current Concerns  Post-Acute Placement   Other Concerns:    SOCIAL WORK ASSESSMENT / PLAN CSW met with pt, pt's husband, and in laws at bedside. Pt alert and oriented. Pt lives with her husband and has 3 children, 2 of which live locally. Family appears to be very involved and supportive. At baseline, pt ambulates with a walker. She has someone come in once a week to help with cleaning and her husband does all the cooking and driving. She had been declining at home and fell last Sunday. Pt came to ED on Tuesday and admitted with TIA/UTI. Pt was evaluated by PT and recommendation is for SNF. CSW discussed placement process, including [employer] authorization. Pt and family report understanding and request placement in [place] if possible. SNF list provided.   Assessment/plan status:  Psychosocial Support/Ongoing Assessment of Needs Other assessment/ plan:   Information/referral to community resources:   SNF list    PATIENT'S/FAMILY'S RESPONSE TO PLAN OF CARE: Pt and family feel [facility] prior to return home would be best option for pt. CSW will fax out [facility] and request [facility] authorization be initiated.       Nicole Williamson, [place] [phone]

## 2012-07-20 NOTE — Progress Notes (Signed)
Pt c/o itching to LLE.  Pt does not have anything for itching.  Dr. Karilyn Cota notified via text page.

## 2012-07-20 NOTE — Evaluation (Signed)
Physical Therapy Evaluation Patient Details Name: Nicole Williamson MRN: [id number] DOB: [birth year] Today's Date: 07/20/2012 Time: 4098-1191 PT Time Calculation (min): 48 min  PT Assessment / Plan / Recommendation History of Present Illness  Pt has been in a decline of general strength at home and this led to a fall on 07-17-12.  She is now hospitalized with TIA/CVA .  Clinical Impression   Pt was seen for evaluation and found to have significant, generalized weakness.  I did not see any significant difference in strength from left to right and her coordination appeared to be WNL   She required SBA while sitting, but this is most likely due to generalized weakness.  She is totally unable to even stand, at this point, and I am strongly recommending SNF at d/c.  Both pt and husband are agreeable.    PT Assessment  Patient needs continued PT services    Follow Up Recommendations  SNF    Does the patient have the potential to tolerate intense rehabilitation    No  Barriers to Discharge Decreased caregiver support husband has been receiving radiation therapy for prostate CA...he is also elderly    Equipment Recommendations  None recommended by PT    Recommendations for Other Services     Frequency Min 3X/week    Precautions / Restrictions Precautions Precautions: Fall Restrictions Weight Bearing Restrictions: No   Pertinent Vitals/Pain       Mobility  Bed Mobility Bed Mobility: Supine to Sit Supine to Sit: HOB elevated;3: Mod assist Details for Bed Mobility Assistance: due to significant generalized weakness, pt had difficulty psuhing her body weight forward to EOB Transfers Transfers: Sit to Stand;Stand to Sit Sit to Stand: 1: +1 Total assist;From bed Stand to Sit: 2: Max assist;To chair/3-in-1 Details for Transfer Assistance: we attempted standing with a walker but pt was unable to lift any body weight up off of the bed...this was compounded by fear of falling due to her recent  fall Ambulation/Gait Ambulation/Gait Assistance: Not tested (comment) (unable to stand)    Exercises     PT Diagnosis: Difficulty walking;Generalized weakness  PT Problem List: Decreased strength;Decreased activity tolerance;Decreased mobility PT Treatment Interventions: Functional mobility training;Therapeutic activities;Therapeutic exercise;Patient/family education     PT Goals(Current goals can be found in the care plan section) Acute Rehab PT Goals Patient Stated Goal: none stated PT Goal Formulation: With patient/family Time For Goal Achievement: 08/03/12 Potential to Achieve Goals: Fair  Visit Information  Last PT Received On: 07/20/12 Assistance Needed: +2 PT/OT Co-Evaluation/Treatment: Yes History of Present Illness: Pt has been in a decline of general strength at home and this led to a fall on 07-17-12.  She is now hospitalized with TIA/CVA .       Prior Functioning  Home Living Family/patient expects to be discharged to:: Private residence Living Arrangements: Spouse/significant other Available Help at Discharge: Family;Available 24 hours/day Type of Home: House Home Access: Ramped entrance Home Layout: Able to live on main level with bedroom/bathroom Home Equipment: Walker - 4 wheels Additional Comments: pt did was not able to tell us about other equipment in the home Prior Function Level of Independence: Needs assistance ADL's / Homemaking Assistance Needed: Recently patient has required assistance with bathing and dressing.  Comments: pt has been needing escalating amounts of assistance at home in recent weeks Communication Communication: No difficulties Dominant Hand: Right    Cognition  Cognition Arousal/Alertness: Awake/alert Behavior During Therapy: Anxious Overall Cognitive Status: Within Functional Limits for tasks assessed  Extremity/Trunk Assessment Upper Extremity Assessment Upper Extremity Assessment: Generalized weakness Lower Extremity  Assessment Lower Extremity Assessment: Generalized weakness Cervical / Trunk Assessment Cervical / Trunk Assessment: Kyphotic   Balance Balance Balance Assessed: Yes Static Sitting Balance Static Sitting - Balance Support: No upper extremity supported;Feet supported Static Sitting - Level of Assistance: 5: Stand by assistance  End of Session PT - End of Session Equipment Utilized During Treatment: Gait belt Activity Tolerance: Patient limited by fatigue;Patient limited by lethargy Patient left: in chair;with call bell/phone within reach;with chair alarm set Nurse Communication: Mobility status  GP     Myrlene Broker L 07/20/2012, 11:00 AM

## 2012-07-20 NOTE — Evaluation (Addendum)
Occupational Therapy Evaluation Patient Details Name: Nicole Williamson MRN: [id number] DOB: Jul 07, 1931 Today's Date: 07/20/2012 Time: 7829-5621 OT Time Calculation (min): 35 min  OT Assessment / Plan / Recommendation History of present illness Pt has been in a decline of general strength at home and this led to a fall on 07-17-12.  She is now hospitalized with TIA/CVA .   Clinical Impression   Patient is a 79 y/o female s/p fall at home that has led to a TIA/CVA. Patient will benefit from acute OT services to increase ADL performance, BUE strength and endurance, and functional transfers. Recommend SNF at discharge.      OT Assessment  Patient needs continued OT Services    Follow Up Recommendations  SNF       Equipment Recommendations   (defer to next venue)       Frequency  Min 2X/week    Precautions / Restrictions Precautions Precautions: Fall Restrictions Weight Bearing Restrictions: No   Pertinent Vitals/Pain No complaints.    ADL  Lower Body Dressing: Simulated;+1 Total assistance Where Assessed - Lower Body Dressing: Supine, head of bed up Toilet Transfer: Performed;+2 Total assistance Toilet Transfer: Patient Percentage: 10% Toilet Transfer Method: Squat pivot Toilet Transfer Equipment:  (to recliner) Transfers/Ambulation Related to ADLs: Patient very anxious during transfer and required max vc's for technique and encouragement.    OT Diagnosis: Generalized weakness  OT Problem List: Decreased strength;Decreased knowledge of use of DME or AE;Decreased activity tolerance OT Treatment Interventions: Self-care/ADL training;Therapeutic exercise;Neuromuscular education;Patient/family education;Therapeutic activities;Modalities;Balance training   OT Goals(Current goals can be found in the care plan section) Acute Rehab OT Goals Patient Stated Goal: none stated  Visit Information  Last OT Received On: 07/20/12 Assistance Needed: +2 History of Present Illness: Pt has  been in a decline of general strength at home and this led to a fall on 07-17-12.  She is now hospitalized with TIA/CVA .       Prior Functioning     Home Living Family/patient expects to be discharged to:: Private residence Living Arrangements: Spouse/significant other Available Help at Discharge: Family;Available 24 hours/day Type of Home: House Home Access: Ramped entrance Home Layout: Able to live on main level with bedroom/bathroom Home Equipment: Walker - 4 wheels Additional Comments: pt did was not able to tell us about other equipment in the home Prior Function Level of Independence: Needs assistance ADL's / Homemaking Assistance Needed: Recently patient has required assistance with bathing and dressing.  Comments: pt has been needing escalating amounts of assistance at home in recent weeks Communication Communication: No difficulties Dominant Hand: Right         Vision/Perception Vision - History Baseline Vision: Wears glasses only for reading Patient Visual Report: No change from baseline   Cognition  Cognition Arousal/Alertness: Awake/alert Behavior During Therapy: Anxious Overall Cognitive Status: Within Functional Limits for tasks assessed    Extremity/Trunk Assessment Upper Extremity Assessment Upper Extremity Assessment: Generalized weakness Lower Extremity Assessment Lower Extremity Assessment: Generalized weakness Cervical / Trunk Assessment Cervical / Trunk Assessment: Kyphotic     Mobility Bed Mobility Bed Mobility: Supine to Sit Supine to Sit: HOB elevated;3: Mod assist Details for Bed Mobility Assistance: due to significant generalized weakness, pt had difficulty psuhing her body weight forward to EOB Transfers Sit to Stand: 1: +2 Total assist;From bed Sit to Stand: Patient Percentage: 10% Stand to Sit: 2: Max assist;To chair/3-in-1 Details for Transfer Assistance: we attempted standing with a walker but pt was unable to lift any body weight  up off of the bed...this was compounded by fear of falling due to her recent fall        Balance Balance Balance Assessed: Yes Static Sitting Balance Static Sitting - Balance Support: No upper extremity supported;Feet supported Static Sitting - Level of Assistance: 5: Stand by assistance   End of Session OT - End of Session Equipment Utilized During Treatment: Gait belt Activity Tolerance: Patient tolerated treatment well Patient left: in chair;with call bell/phone within reach;with chair alarm set    [employer], OTR/L,CBIS   07/20/2012, 11:03 AM

## 2012-07-20 NOTE — Clinical Social Work Placement (Signed)
Clinical Social Work Department CLINICAL SOCIAL WORK PLACEMENT NOTE 07/20/2012  Patient:  Nicole Williamson, Nicole Williamson  Account Number:  [id number] Admit date:  07/18/2012  Clinical Social Worker:  Derenda Fennel, LCSW  Date/time:  07/20/2012 01:50 PM  Clinical Social Work is seeking post-discharge placement for this patient at the following level of care:   SKILLED NURSING   (*CSW will update this form in Epic as items are completed)   07/20/2012  Patient/family provided with [facility] Department of Clinical Social Work's list of facilities offering this level of care within the geographic area requested by the patient (or if unable, by the patient's family).  07/20/2012  Patient/family informed of their freedom to choose among providers that offer the needed level of care, that participate in Medicare, Medicaid or managed care program needed by the patient, have an available bed and are willing to accept the patient.  07/20/2012  Patient/family informed of [facility]' ownership interest in [facility], as well as of the fact that they are under no obligation to receive care at this facility.  PASARR submitted to EDS on 07/20/2012 PASARR number received from [employer] on 07/20/2012  FL2 transmitted to all facilities in geographic area requested by pt/family on  07/20/2012 FL2 transmitted to all facilities within larger geographic area on   Patient informed that his/her managed care company has contracts with or will negotiate with  certain facilities, including the following:     Patient/family informed of bed offers received:   Patient chooses bed at  Physician recommends and patient chooses bed at    Patient to be transferred to  on   Patient to be transferred to facility by   The following physician request were entered in Epic:   Additional Comments:  Derenda Fennel, LCSW [phone]

## 2012-07-20 NOTE — Progress Notes (Signed)
Margrete Delude AVW:[id number] DOB: 15-Jan-1931 DOA: 07/18/2012 PCP: No primary provider on file.   Subjective: This lady feels reasonably well except somewhat tired. She has been evaluated by physical therapy who recommend skilled nursing facility for placement. She has not been seen by neurology yet. MRI brain scan was done, did not show any acute CVA. 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           Physical Exam: Blood pressure 144/75, pulse 70, temperature 98.2 F (36.8 C), temperature source Oral, resp. rate 20, height 5\' 5"  (1.651 m), weight 71.9 kg (158 lb 8.2 oz), SpO2 95.00%. She is alert and orientated. There are no obvious focal neurological signs. She does not have any obvious cerebellar signs either. She does have left lower leg cellulitis. There are no carotid bruits. She is in sinus rhythm. There are no murmurs. Lung fields are clear.   Investigations:  Recent Results (from the past 240 hour(s))  URINE CULTURE     Status: None   Collection Time    07/18/12  5:00 PM      Result Value Range Status   Specimen Description URINE, CLEAN CATCH   Final   Special Requests NONE   Final   Culture  Setup Time 07/19/2012 00:15   Final   Colony Count PENDING   Incomplete   Culture Culture reincubated for better growth   Final   Report Status PENDING   Incomplete     Basic Metabolic Panel:  Recent Labs  82/95/62 1629 07/20/12 0556  NA 137 137  K 3.8 3.5  CL 99 103  CO2 30 28  GLUCOSE 163* 145*  BUN 26* 12  CREATININE 0.57 0.40*  CALCIUM 9.8 8.8   Liver Function Tests:  Recent Labs  07/18/12 1629  AST 34  ALT 26  ALKPHOS 112  BILITOT 0.4  PROT 6.3  ALBUMIN 3.3*     CBC:  Recent Labs  07/18/12 1629  WBC 5.9  NEUTROABS 4.0  HGB 11.9*  HCT 35.9*  MCV 90.4  PLT 243    Dg Pelvis 1-2 Views  07/18/2012   *RADIOLOGY REPORT*  Clinical Data: Fall.  Weakness.  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  I see no acute fracture, subluxation or dislocation.  SI joints and hip joints are symmetric and unremarkable.  Rightward scoliosis and degenerative changes in the visualized lower lumbar spine.  IMPRESSION: No acute bony abnormality.   Original Report Authenticated By: Charlett Nose, M.D.   Ct Head Wo Contrast  07/18/2012   *RADIOLOGY REPORT*  Clinical Data:  aphasia  CT HEAD WITHOUT CONTRAST CT CERVICAL SPINE WITHOUT CONTRAST  Technique:  Multidetector CT imaging of the head and cervical spine was performed following the standard protocol without intravenous contrast.  Multiplanar CT image reconstructions of the cervical spine were also generated.  Comparison:   None  CT HEAD  Findings: There is diffuse patchy low density throughout the subcortical and periventricular white matter consistent with chronic small vessel ischemic change.  There is prominence of the sulci and ventricles consistent with brain atrophy.  The paranasal sinuses and the mastoid air cells are clear.  The skull is intact.  IMPRESSION:  1.  Small vessel ischemic change and brain atrophy.  CT CERVICAL SPINE  Findings: Normal alignment of the cervical spine.  The vertebral body heights and  disc spaces are well preserved.  No fracture or subluxation identified.Lung apices appear clear.  IMPRESSION:  No acute findings.   Original Report Authenticated By: Signa Kell, M.D.   Ct Cervical Spine Wo Contrast  07/18/2012   *RADIOLOGY REPORT*  Clinical Data:  aphasia  CT HEAD WITHOUT CONTRAST CT CERVICAL SPINE WITHOUT CONTRAST  Technique:  Multidetector CT imaging of the head and cervical spine was performed following the standard protocol without intravenous contrast.  Multiplanar CT image reconstructions of the cervical spine were also generated.  Comparison:   None  CT HEAD  Findings: There is diffuse patchy low density throughout the subcortical and  periventricular white matter consistent with chronic small vessel ischemic change.  There is prominence of the sulci and ventricles consistent with brain atrophy.  The paranasal sinuses and the mastoid air cells are clear.  The skull is intact.  IMPRESSION:  1.  Small vessel ischemic change and brain atrophy.  CT CERVICAL SPINE  Findings: Normal alignment of the cervical spine.  The vertebral body heights and disc spaces are well preserved.  No fracture or subluxation identified.Lung apices appear clear.  IMPRESSION:  No acute findings.   Original Report Authenticated By: Signa Kell, M.D.   Mr [facility] Wo Contrast  07/19/2012   *RADIOLOGY REPORT*  Clinical Data:  79 year old female with difficulty walking and talking.  Transient ischemic attack versus stroke.  Comparison: Carotid Doppler ultrasound 07/19/2012.  CT without contrast 07/18/2012.  MRI HEAD WITHOUT CONTRAST  Technique: Multiplanar, multiecho pulse sequences of the brain and surrounding structures were obtained according to standard protocol without intravenous contrast.  Findings: No restricted diffusion to suggest acute infarction.  No midline shift, mass effect, evidence of mass lesion, ventriculomegaly, extra-axial collection or acute intracranial hemorrhage.  Cervicomedullary junction and pituitary are within normal limits.  Negative visualized cervical spine.  Major intracranial vascular flow voids are preserved.  Occasional chronic lacunar infarcts in the bilateral basal ganglia/external capsules.  Nonspecific additional patchy and scattered cerebral white matter T2 and FLAIR hyperintensity.  No cortical encephalomalacia.  Brainstem and cerebellum are within normal limits.  Normal bone marrow signal. Visualized orbit soft tissues are within normal limits.  Minor paranasal sinus mucosal thickening.  Grossly normal visualized internal auditory structures.  Mastoids are clear.  Trace retained secretions in the nasopharynx.  Negative scalp soft  tissues.  IMPRESSION: 1. No acute intracranial abnormality. 2.  Mild for age chronic small vessel disease. 3.  See MRA findings below.  MRA HEAD WITHOUT CONTRAST  Technique: Angiographic images of the Circle of Willis were obtained using MRA technique without  intravenous contrast.  Findings: Antegrade flow in the posterior circulation with codominant distal vertebral arteries.  Patent vertebrobasilar junction.  Normal PICA origins.  Normal AICA origins.  No basilar stenosis.  SCA and PCA origins are within normal limits.  Posterior communicating arteries are diminutive or absent.  Bilateral PCA branches are within normal limits.  Antegrade flow in both ICA siphons.  No ICA stenosis is identified.  Both ophthalmic artery origins are within normal limits.  However, there is a right supraclinoid 2-3 mm aneurysm or infundibulum arising just beyond the right ophthalmic artery origin.  See series 103 image 28 and series 5 image 102.  Normal carotid termini.  Left ACA A1 segment is mildly dominant. Anterior communicating arteries diminutive or absent.  Visualized ACA branches are within normal limits.  Bilateral MCA M1 segments are within normal limits.  Both MCA bifurcations are patent. Questionable moderate to severe  stenosis at the left MCA anterior sylvian M2 branch origin (series 103 image 31). There is normal appearing preserved distal flow signal.  Otherwise negative visualized bilateral MCA branches.  IMPRESSION: 1. Suggestion of a moderate to severe stenosis of the left MCA anterior sylvian M2 branch origin.  Preserved distal flow signal in that branch. 2.  No other intracranial stenosis identified. 3.  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  As this likely is too small to treat, surveillance MRA may be most appropriate (annual, biennial).   Original Report Authenticated By: Erskine Speed, M.D.   Mri Brain Without Contrast  07/19/2012   *RADIOLOGY REPORT*  Clinical Data:   78 year old female with difficulty walking and talking.  Transient ischemic attack versus stroke.  Comparison: Carotid Doppler ultrasound 07/19/2012.  CT without contrast 07/18/2012.  MRI HEAD WITHOUT CONTRAST  Technique: Multiplanar, multiecho pulse sequences of the brain and surrounding structures were obtained according to standard protocol without intravenous contrast.  Findings: No restricted diffusion to suggest acute infarction.  No midline shift, mass effect, evidence of mass lesion, ventriculomegaly, extra-axial collection or acute intracranial hemorrhage.  Cervicomedullary junction and pituitary are within normal limits.  Negative visualized cervical spine.  Major intracranial vascular flow voids are preserved.  Occasional chronic lacunar infarcts in the bilateral basal ganglia/external capsules.  Nonspecific additional patchy and scattered cerebral white matter T2 and FLAIR hyperintensity.  No cortical encephalomalacia.  Brainstem and cerebellum are within normal limits.  Normal bone marrow signal. Visualized orbit soft tissues are within normal limits.  Minor paranasal sinus mucosal thickening.  Grossly normal visualized internal auditory structures.  Mastoids are clear.  Trace retained secretions in the nasopharynx.  Negative scalp soft tissues.  IMPRESSION: 1. No acute intracranial abnormality. 2.  Mild for age chronic small vessel disease. 3.  See MRA findings below.  MRA HEAD WITHOUT CONTRAST  Technique: Angiographic images of the Circle of Willis were obtained using MRA technique without  intravenous contrast.  Findings: Antegrade flow in the posterior circulation with codominant distal vertebral arteries.  Patent vertebrobasilar junction.  Normal PICA origins.  Normal AICA origins.  No basilar stenosis.  SCA and PCA origins are within normal limits.  Posterior communicating arteries are diminutive or absent.  Bilateral PCA branches are within normal limits.  Antegrade flow in both ICA siphons.  No  ICA stenosis is identified.  Both ophthalmic artery origins are within normal limits.  However, there is a right supraclinoid 2-3 mm aneurysm or infundibulum arising just beyond the right ophthalmic artery origin.  See series 103 image 28 and series 5 image 102.  Normal carotid termini.  Left ACA A1 segment is mildly dominant. Anterior communicating arteries diminutive or absent.  Visualized ACA branches are within normal limits.  Bilateral MCA M1 segments are within normal limits.  Both MCA bifurcations are patent. Questionable moderate to severe stenosis at the left MCA anterior sylvian M2 branch origin (series 103 image 31). There is normal appearing preserved distal flow signal.  Otherwise negative visualized bilateral MCA branches.  IMPRESSION: 1. Suggestion of a moderate to severe stenosis of the left MCA anterior sylvian M2 branch origin.  Preserved distal flow signal in that branch. 2.  No other intracranial stenosis identified. 3.  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  As this likely is too small to treat, surveillance MRA may be most appropriate (annual, biennial).   Original Report Authenticated By: Erskine Speed, M.D.   US Carotid Duplex  Bilateral  07/19/2012   *RADIOLOGY REPORT*  Clinical Data: Transient ischemic attack  BILATERAL CAROTID DUPLEX ULTRASOUND  Technique: Wallace Cullens scale imaging, color Doppler and duplex ultrasound were performed of bilateral carotid and vertebral arteries in the neck.  Comparison:  CT head and cervical spine 07/18/2012  Criteria:  Quantification of carotid stenosis is based on velocity parameters that correlate the residual internal carotid diameter with NASCET-based stenosis levels, using the diameter of the distal internal carotid lumen as the denominator for stenosis measurement.  The following velocity measurements were obtained:                   PEAK SYSTOLIC/END DIASTOLIC RIGHT ICA:                        156/24cm/sec CCA:                         109/12cm/sec SYSTOLIC ICA/CCA RATIO:     1.4 DIASTOLIC ICA/CCA RATIO:    2.0 ECA:                        205cm/sec  LEFT ICA:                        114/13cm/sec CCA:                        101/8cm/sec SYSTOLIC ICA/CCA RATIO:     1.1 DIASTOLIC ICA/CCA RATIO:    1.8 ECA:                        156cm/sec  Findings:  RIGHT CAROTID ARTERY: Heterogeneous atherosclerotic plaque in the carotid bifurcation extending into the proximal internal and external carotid arteries.  Focal acentric heterogeneous plaque in the proximal internal carotid artery results of focally elevated peak systolic velocities, turbulence and spectral broadening.  RIGHT VERTEBRAL ARTERY:  Patent with normal antegrade flow.  LEFT CAROTID ARTERY: Mild heterogeneous atherosclerotic plaque in the carotid bulb extending into the proximal internal carotid artery.  No hemodynamically significant stenosis by gray scale, color Doppler, pulsed Doppler or spectral waveform analysis.  LEFT VERTEBRAL ARTERY:  Patent with normal antegrade flow.  IMPRESSION:  1.  Heterogeneous atherosclerotic plaque results in 50 - 69% estimated diameter stenosis in the right internal carotid artery.  2.  Heterogeneous atherosclerotic plaque results in less than 50% diameter stenosis in the left internal carotid artery.  3.  Bilateral vertebral arteries are patent with antegrade flow.  Signed,  Sterling Big, MD Vascular & Interventional Radiologist [facility]   Original Report Authenticated By: Malachy Moan, M.D.      Medications: I have reviewed the patient's current medications.  Impression: 1. TIA  . Moderate to severe stenosis in the left MCA. 2. UTI. Culture pending. 3. Left lower leg cellulitis. 4. Diabetes mellitus.     Plan: 1. Await neurology consultation.  Consultants:  Await neurology consultation.   Procedures:  Echocardiogram: ------------------------------------------------------------ Study  Conclusions  - Left ventricle: The cavity size was normal. There was mild concentric hypertrophy. Systolic function was vigorous. The estimated ejection fraction was in the range of 65% to 70%. Wall motion was normal; there were no regional wall motion abnormalities. Doppler parameters are consistent with abnormal left ventricular relaxation (grade 1 diastolic dysfunction). Doppler parameters are consistent with elevated mean left atrial filling pressure. -  Aortic valve: Poorly visualized. Mildly calcified annulus. Mildly calcified leaflets. No significant regurgitation. - Mitral valve: Severely calcified annulus, particularly posterior.Mildly thickened leaflets, possible mild prolpase of portion of anterior leaflet - not well seen. Thickening of the subvalvular apparatus. There was no evidence for stenosis. Trivial regurgitation. Peak gradient: 3mm Hg (D). - Left atrium: The atrium was moderately dilated. - Right atrium: Central venous pressure: 8mm Hg (est). - Atrial septum: No defect or patent foramen ovale was identified based on limited images. - Tricuspid valve: Trivial regurgitation. - Pulmonary arteries: Systolic pressure could not be accurately estimated. - Pericardium, extracardiac: There was no pericardial effusion. Impressions:  - No prior study for comparison. Mild LVH with LVEF 65-70%, grade 1 diastolic dysfunction with increased filling pressures. Moderate left atrial enlargement. Severe MAC with thickened mitral leaflets, possible mild prolapse of portion of anterior leaflet, but trivial mitral regurgitation. Aortic valve mildly calcfied but not well seen. No obvious PFO or ASD. Trivial tricuspid regurgitation.Cannot assess PASP, CVP estimated at 8 mmHg. Transthoracic echocardiography. M-mode, complete 2D, spectral Doppler, and color Doppler. Height: Height: 165.1cm. Height: 65in. Weight: Weight: 71.7kg. Weight: 157.7lb. Body mass index: BMI: 26.3kg/m^2. Body  surface area: BSA: 1.39m^2. Patient status: Inpatient. Location: Bedside.   Antibiotics:  Intravenous Rocephin started 07/18/2012. Discontinued 07/20/2012.  Intravenous vancomycin started 07/18/2012. Discontinued 07/20/2012.  Intravenous Levaquin started 07/20/2012.                  Code Status: Full code.  Family Communication: Discussed plan with patient and husband at the bedside.   Disposition Plan: Depending on progress.  Time spent: 20 minutes.   LOS: 2 days   Wilson Singer Pager [phone]  07/20/2012, 12:02 PM

## 2012-07-20 NOTE — Progress Notes (Signed)
Pt c/o heartburn after eating lunch.  Pt requesting something for heartburn.  Dr. Karilyn Cota notified via text page.

## 2012-07-21 ENCOUNTER — Ambulatory Visit (HOSPITAL_COMMUNITY)
Admit: 2012-07-21 | Discharge: 2012-07-21 | Disposition: A | Discharge status: Home / Self Care | Payer: Medicare HMO | Attending: Neurology | Admitting: Neurology

## 2012-07-21 LAB — URINE CULTURE

## 2012-07-21 LAB — GLUCOSE, CAPILLARY

## 2012-07-21 LAB — HOMOCYSTEINE: Homocysteine: 11.3 umol/L (ref 4.0–15.4)

## 2012-07-21 MED ORDER — ASPIRIN 325 MG PO TABS: 325.0000 mg | ORAL_TABLET | Freq: Every day | ORAL | Status: AC

## 2012-07-21 MED ORDER — LEVOFLOXACIN 750 MG PO TABS: 750.0000 mg | ORAL_TABLET | Freq: Every day | ORAL | Status: DC

## 2012-07-21 MED ORDER — LEVOFLOXACIN 750 MG PO TABS
750.0000 mg | ORAL_TABLET | Freq: Every day | ORAL | Status: DC
  Administered 2012-07-21: 750 mg via ORAL
  Filled 2012-07-21: qty 1

## 2012-07-21 NOTE — Procedures (Signed)
[facility] Dynasty Holquin A. Gerilyn Pilgrim, MD     [email or web address]        NAMELAYSHA, CHILDERS                ACCOUNT NO.:  [id number]  MEDICAL RECORD NO.:  [id number]  LOCATION:  A335                          FACILITY:  [facility]  PHYSICIAN:  Pablo Mathurin A. Gerilyn Pilgrim, M.D. DATE OF BIRTH:  Oct 31, 1931  DATE OF PROCEDURE: DATE OF DISCHARGE:  07/21/2012                             EEG INTERPRETATION   This is a 79 year old female who presents with episodes of word-finding difficulties and confusion worrisome for seizures.  MEDICATIONS:  Tylenol, aspirin, Levaquin, insulin, Ativan, Ditropan, Actos.  ANALYSIS:  A 16-channel recording using standard 10/20 measurement is carried out for 20 minutes.  There is a posterior dominant rhythm of 10 hertz, which attenuates with eye opening.  There is beta activity observed in the frontal areas.  Awake and sleep activities are recorded. K complexes, sleep spindles and slow wave sleep are seen.  Photic stimulation and hypoventilation were not carried out.  The patient is noted to have infrequent left temporal theta and delta slowing involving T3 and T4 area.  There is no epileptiform activity is observed.  No lateralized slowing.  IMPRESSION:  Mildly abnormal recording showing infrequent left temporal slowing, which can be observed and focal cerebral disturbance and/or epileptic focus.     Kosha Jaquith A. Gerilyn Pilgrim, M.D.     KAD/MEDQ  D:  07/21/2012  T:  07/21/2012  Job:  [id number]

## 2012-07-21 NOTE — Clinical Social Work Note (Signed)
Pt d/c today to SNF. CSW presented bed offers and pt and family choose [facility]. Facility notified and received insurance authorization. Pt to transfer with family. D/C summary faxed.   Derenda Fennel, [place] [phone]

## 2012-07-21 NOTE — Clinical Social Work Placement (Signed)
Clinical Social Work Department CLINICAL SOCIAL WORK PLACEMENT NOTE 07/21/2012  Patient:  Nicole Williamson, Nicole Williamson  Account Number:  [id number] Admit date:  07/18/2012  Clinical Social Worker:  Derenda Fennel, LCSW  Date/time:  07/20/2012 01:50 PM  Clinical Social Work is seeking post-discharge placement for this patient at the following level of care:   SKILLED NURSING   (*CSW will update this form in Epic as items are completed)   07/20/2012  Patient/family provided with [facility] Department of Clinical Social Work's list of facilities offering this level of care within the geographic area requested by the patient (or if unable, by the patient's family).  07/20/2012  Patient/family informed of their freedom to choose among providers that offer the needed level of care, that participate in Medicare, Medicaid or managed care program needed by the patient, have an available bed and are willing to accept the patient.  07/20/2012  Patient/family informed of [facility]' ownership interest in [facility], as well as of the fact that they are under no obligation to receive care at this facility.  PASARR submitted to EDS on 07/20/2012 PASARR number received from [employer] on 07/20/2012  FL2 transmitted to all facilities in geographic area requested by pt/family on  07/20/2012 FL2 transmitted to all facilities within larger geographic area on   Patient informed that his/her managed care company has contracts with or will negotiate with  certain facilities, including the following:     Patient/family informed of bed offers received:  07/21/2012 Patient chooses bed at [facility] [place] Physician recommends and patient chooses bed at  [facility] [place]  Patient to be transferred to [facility] [place] on  07/21/2012 Patient to be transferred to facility by family  The following physician request were entered in Epic:   Additional Comments:  Derenda Fennel, LCSW [phone]

## 2012-07-21 NOTE — Discharge Summary (Signed)
Physician Discharge Summary  Nicole Williamson [name] DOB: [birth year] DOA: 07/18/2012  PCP: No primary provider on file.  Admit date: 07/18/2012 Discharge date: 07/21/2012  Time spent: Greater than 30 minutes  Recommendations for Outpatient Follow-up:  1. Followup with neurology, Dr Gerilyn Pilgrim for possible EEG.  2. Repeat urine culture after a course of Levaquin for UTI.  Discharge Diagnoses:  1. Gait abnormality, multifactorial. No evidence of CVA. 2. Diabetes mellitus. 3. Escherichia coli UTI    Discharge Condition: Stable.  Diet recommendation: , Carbohydrate-Modified diet.  Filed Weights   07/18/12 1522 07/18/12 2049  Weight: 86.183 kg (190 lb) 71.9 kg (158 lb 8.2 oz)    History of present illness:  This 79 year old anxious lady presented to the hospital with symptoms of weakness and fall. Please see initial history as outlined below: HPI:  79 year old female who came to the ED today after patient was having difficulty walking and also difficulty finding words. Patient's symptoms started 2 days ago when she fell and had difficulty walking since then. As per patient she was noticed to have dragging her left leg, and also had some aphasia and difficulty finding words. She does not have a history of stroke in the past, patient was being treated for UTI. At this time most of her symptoms are resolved. She denies seizure, no chest pain shortness of breath, no nausea vomiting or diarrhea. She denies fever but admits to having some urgency and increased frequency of urination  Hospital Course:  Patient was admitted and initially was felt that she had had a TIA or CVA. Fortunately, MRI brain scan did not show any evidence of CVA neurology, Dr Gerilyn Pilgrim, saw the patient and felt that in fact she did not have any evidence of neurological problems and that her gait instability was likely to be multifactorial including UTI, osteoarthritis and general debility. He felt that she would benefit from  physical therapy and rehabilitation in a skilled nursing facility. Physical therapy also saw the patient and agreed with this. She does have Escherichia coli in the urine growing and is on Levaquin. We will await sensitivities but the urine is to be recultured in approximately one week to make sure the UTI has resolved. She seems very anxious person and is hesitant to be an obstacle for her. She is now stable for discharge to the skilled nursing facility today.  Procedures:  Echocardiogram:  ------------------------------------------------------------ Study Conclusions  - Left ventricle: The cavity size was normal. There was mild concentric hypertrophy. Systolic function was vigorous. The estimated ejection fraction was in the range of 65% to 70%. Wall motion was normal; there were no regional wall motion abnormalities. Doppler parameters are consistent with abnormal left ventricular relaxation (grade 1 diastolic dysfunction). Doppler parameters are consistent with elevated mean left atrial filling pressure. - Aortic valve: Poorly visualized. Mildly calcified annulus. Mildly calcified leaflets. No significant regurgitation. - Mitral valve: Severely calcified annulus, particularly posterior.Mildly thickened leaflets, possible mild prolpase of portion of anterior leaflet - not well seen. Thickening of the subvalvular apparatus. There was no evidence for stenosis. Trivial regurgitation. Peak gradient: 3mm Hg (D). - Left atrium: The atrium was moderately dilated. - Right atrium: Central venous pressure: 8mm Hg (est). - Atrial septum: No defect or patent foramen ovale was identified based on limited images. - Tricuspid valve: Trivial regurgitation. - Pulmonary arteries: Systolic pressure could not be accurately estimated. - Pericardium, extracardiac: There was no pericardial effusion. Impressions:  - No prior study for comparison. Mild LVH with LVEF 65-70%,  grade 1 diastolic  dysfunction with increased filling pressures. Moderate left atrial enlargement. Severe MAC with thickened mitral leaflets, possible mild prolapse of portion of anterior leaflet, but trivial mitral regurgitation. Aortic valve mildly calcfied but not well seen. No obvious PFO or ASD. Trivial tricuspid regurgitation.Cannot assess PASP, CVP estimated at 8 mmHg. Transthoracic echocardiography. M-mode, complete 2D, spectral Doppler, and color Doppler. Height: Height: 165.1cm. Height: 65in. Weight: Weight: 71.7kg. Weight: 157.7lb. Body mass index: BMI: 26.3kg/m^2. Body surface area: BSA: 1.69m^2. Patient status: Inpatient. Location: Bedside.  Consultations:  Neurology, Dr Gerilyn Pilgrim.  Discharge Exam: Filed Vitals:   07/20/12 1841 07/20/12 2106 07/21/12 0205 07/21/12 0513  BP: 127/72 125/69 135/75 132/75  Pulse: 68 67 63 62  Temp: 98.2 F (36.8 C) 98.4 F (36.9 C) 98.3 F (36.8 C) 98.1 F (36.7 C)  TempSrc: Oral Oral Oral Oral  Resp: 18 18 18 18   Height:      Weight:      SpO2: 94% 95% 96% 95%    General: She looks systemically well, anxious. Cardiovascular: Heart sounds are present without murmurs or added sounds. She is in sinus rhythm. There are no carotid bruits. Respiratory: Lung fields are clear. She is alert and orientated without any focal neurological signs.  Discharge Instructions  Discharge Orders   Future Appointments Provider Department Dept Phone   07/21/2012 2:45 PM [facility] EEG [phone]   Future Orders Complete By Expires     Diet - low sodium heart healthy  As directed     Increase activity slowly  As directed         Medication List    STOP taking these medications       aspirin EC 81 MG tablet  Replaced by:  aspirin 325 MG tablet      TAKE these medications       aspirin 325 MG tablet  Take 1 tablet (325 mg total) by mouth daily.     calcium carbonate 500 MG chewable tablet  Commonly known as:  TUMS - dosed  in mg elemental calcium  Chew 1 tablet by mouth daily as needed for heartburn.     levofloxacin 750 MG tablet  Commonly known as:  LEVAQUIN  Take 1 tablet (750 mg total) by mouth daily.     lisinopril 10 MG tablet  Commonly known as:  PRINIVIL,ZESTRIL  Take 10 mg by mouth daily.     oxybutynin 15 MG 24 hr tablet  Commonly known as:  DITROPAN XL  Take 15 mg by mouth every evening.     pioglitazone 30 MG tablet  Commonly known as:  ACTOS  Take 30 mg by mouth every evening. Taken with evening meal       No Known Allergies    The results of significant diagnostics from this hospitalization (including imaging, microbiology, ancillary and laboratory) are listed below for reference.    Significant Diagnostic Studies: Dg Pelvis 1-2 Views  07/18/2012   *RADIOLOGY REPORT*  Clinical Data: Fall.  Weakness.  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  I see no acute fracture, subluxation or dislocation.  SI joints and hip joints are symmetric and unremarkable.  Rightward scoliosis and degenerative changes in the visualized lower lumbar spine.  IMPRESSION: No acute bony abnormality.   Original Report Authenticated By: Charlett Nose, M.D.   Ct Head Wo Contrast  07/18/2012   *RADIOLOGY REPORT*  Clinical Data:  aphasia  CT HEAD WITHOUT CONTRAST CT CERVICAL SPINE WITHOUT CONTRAST  Technique:  Multidetector CT imaging of the head and cervical spine was performed following the standard protocol without intravenous contrast.  Multiplanar CT image reconstructions of the cervical spine were also generated.  Comparison:   None  CT HEAD  Findings: There is diffuse patchy low density throughout the subcortical and periventricular white matter consistent with chronic small vessel ischemic change.  There is prominence of the sulci and ventricles consistent with brain atrophy.  The  paranasal sinuses and the mastoid air cells are clear.  The skull is intact.  IMPRESSION:  1.  Small vessel ischemic change and brain atrophy.  CT CERVICAL SPINE  Findings: Normal alignment of the cervical spine.  The vertebral body heights and disc spaces are well preserved.  No fracture or subluxation identified.Lung apices appear clear.  IMPRESSION:  No acute findings.   Original Report Authenticated By: Signa Kell, M.D.   Ct Cervical Spine Wo Contrast  07/18/2012   *RADIOLOGY REPORT*  Clinical Data:  aphasia  CT HEAD WITHOUT CONTRAST CT CERVICAL SPINE WITHOUT CONTRAST  Technique:  Multidetector CT imaging of the head and cervical spine was performed following the standard protocol without intravenous contrast.  Multiplanar CT image reconstructions of the cervical spine were also generated.  Comparison:   None  CT HEAD  Findings: There is diffuse patchy low density throughout the subcortical and periventricular white matter consistent with chronic small vessel ischemic change.  There is prominence of the sulci and ventricles consistent with brain atrophy.  The paranasal sinuses and the mastoid air cells are clear.  The skull is intact.  IMPRESSION:  1.  Small vessel ischemic change and brain atrophy.  CT CERVICAL SPINE  Findings: Normal alignment of the cervical spine.  The vertebral body heights and disc spaces are well preserved.  No fracture or subluxation identified.Lung apices appear clear.  IMPRESSION:  No acute findings.   Original Report Authenticated By: Signa Kell, M.D.   Mr [facility] Wo Contrast  07/19/2012   *RADIOLOGY REPORT*  Clinical Data:  79 year old female with difficulty walking and talking.  Transient ischemic attack versus stroke.  Comparison: Carotid Doppler ultrasound 07/19/2012.  CT without contrast 07/18/2012.  MRI HEAD WITHOUT CONTRAST  Technique: Multiplanar, multiecho pulse sequences of the brain and surrounding structures were obtained according to standard protocol without  intravenous contrast.  Findings: No restricted diffusion to suggest acute infarction.  No midline shift, mass effect, evidence of mass lesion, ventriculomegaly, extra-axial collection or acute intracranial hemorrhage.  Cervicomedullary junction and pituitary are within normal limits.  Negative visualized cervical spine.  Major intracranial vascular flow voids are preserved.  Occasional chronic lacunar infarcts in the bilateral basal ganglia/external capsules.  Nonspecific additional patchy and scattered cerebral white matter T2 and FLAIR hyperintensity.  No cortical encephalomalacia.  Brainstem and cerebellum are within normal limits.  Normal bone marrow signal. Visualized orbit soft tissues are within normal limits.  Minor paranasal sinus mucosal thickening.  Grossly normal visualized internal auditory structures.  Mastoids are clear.  Trace retained secretions in the nasopharynx.  Negative scalp soft tissues.  IMPRESSION: 1. No acute intracranial abnormality. 2.  Mild for age chronic small vessel disease. 3.  See MRA findings below.  MRA HEAD WITHOUT CONTRAST  Technique: Angiographic images of the Circle of Willis were obtained using MRA technique without  intravenous contrast.  Findings: Antegrade flow in the posterior circulation with codominant  distal vertebral arteries.  Patent vertebrobasilar junction.  Normal PICA origins.  Normal AICA origins.  No basilar stenosis.  SCA and PCA origins are within normal limits.  Posterior communicating arteries are diminutive or absent.  Bilateral PCA branches are within normal limits.  Antegrade flow in both ICA siphons.  No ICA stenosis is identified.  Both ophthalmic artery origins are within normal limits.  However, there is a right supraclinoid 2-3 mm aneurysm or infundibulum arising just beyond the right ophthalmic artery origin.  See series 103 image 28 and series 5 image 102.  Normal carotid termini.  Left ACA A1 segment is mildly dominant. Anterior communicating  arteries diminutive or absent.  Visualized ACA branches are within normal limits.  Bilateral MCA M1 segments are within normal limits.  Both MCA bifurcations are patent. Questionable moderate to severe stenosis at the left MCA anterior sylvian M2 branch origin (series 103 image 31). There is normal appearing preserved distal flow signal.  Otherwise negative visualized bilateral MCA branches.  IMPRESSION: 1. Suggestion of a moderate to severe stenosis of the left MCA anterior sylvian M2 branch origin.  Preserved distal flow signal in that branch. 2.  No other intracranial stenosis identified. 3.  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  As this likely is too small to treat, surveillance MRA may be most appropriate (annual, biennial).   Original Report Authenticated By: Erskine Speed, M.D.   Mri Brain Without Contrast  07/19/2012   *RADIOLOGY REPORT*  Clinical Data:  79 year old female with difficulty walking and talking.  Transient ischemic attack versus stroke.  Comparison: Carotid Doppler ultrasound 07/19/2012.  CT without contrast 07/18/2012.  MRI HEAD WITHOUT CONTRAST  Technique: Multiplanar, multiecho pulse sequences of the brain and surrounding structures were obtained according to standard protocol without intravenous contrast.  Findings: No restricted diffusion to suggest acute infarction.  No midline shift, mass effect, evidence of mass lesion, ventriculomegaly, extra-axial collection or acute intracranial hemorrhage.  Cervicomedullary junction and pituitary are within normal limits.  Negative visualized cervical spine.  Major intracranial vascular flow voids are preserved.  Occasional chronic lacunar infarcts in the bilateral basal ganglia/external capsules.  Nonspecific additional patchy and scattered cerebral white matter T2 and FLAIR hyperintensity.  No cortical encephalomalacia.  Brainstem and cerebellum are within normal limits.  Normal bone marrow signal.  Visualized orbit soft tissues are within normal limits.  Minor paranasal sinus mucosal thickening.  Grossly normal visualized internal auditory structures.  Mastoids are clear.  Trace retained secretions in the nasopharynx.  Negative scalp soft tissues.  IMPRESSION: 1. No acute intracranial abnormality. 2.  Mild for age chronic small vessel disease. 3.  See MRA findings below.  MRA HEAD WITHOUT CONTRAST  Technique: Angiographic images of the Circle of Willis were obtained using MRA technique without  intravenous contrast.  Findings: Antegrade flow in the posterior circulation with codominant distal vertebral arteries.  Patent vertebrobasilar junction.  Normal PICA origins.  Normal AICA origins.  No basilar stenosis.  SCA and PCA origins are within normal limits.  Posterior communicating arteries are diminutive or absent.  Bilateral PCA branches are within normal limits.  Antegrade flow in both ICA siphons.  No ICA stenosis is identified.  Both ophthalmic artery origins are within normal limits.  However, there is a right supraclinoid 2-3 mm aneurysm or infundibulum arising just beyond the right ophthalmic artery origin.  See series 103 image 28 and series 5 image 102.  Normal carotid termini.  Left ACA A1 segment is  mildly dominant. Anterior communicating arteries diminutive or absent.  Visualized ACA branches are within normal limits.  Bilateral MCA M1 segments are within normal limits.  Both MCA bifurcations are patent. Questionable moderate to severe stenosis at the left MCA anterior sylvian M2 branch origin (series 103 image 31). There is normal appearing preserved distal flow signal.  Otherwise negative visualized bilateral MCA branches.  IMPRESSION: 1. Suggestion of a moderate to severe stenosis of the left MCA anterior sylvian M2 branch origin.  Preserved distal flow signal in that branch. 2.  No other intracranial stenosis identified. 3.  Small 2-3 mm right supraclinoid ICA (paraophthalmic) aneurysm or  infundibulum.  As this likely is too small to treat, surveillance MRA may be most appropriate (annual, biennial).   Original Report Authenticated By: Erskine Speed, M.D.   US Carotid Duplex Bilateral  07/19/2012   *RADIOLOGY REPORT*  Clinical Data: Transient ischemic attack  BILATERAL CAROTID DUPLEX ULTRASOUND  Technique: Wallace Cullens scale imaging, color Doppler and duplex ultrasound were performed of bilateral carotid and vertebral arteries in the neck.  Comparison:  CT head and cervical spine 07/18/2012  Criteria:  Quantification of carotid stenosis is based on velocity parameters that correlate the residual internal carotid diameter with NASCET-based stenosis levels, using the diameter of the distal internal carotid lumen as the denominator for stenosis measurement.  The following velocity measurements were obtained:                   PEAK SYSTOLIC/END DIASTOLIC RIGHT ICA:                        156/24cm/sec CCA:                        109/12cm/sec SYSTOLIC ICA/CCA RATIO:     1.4 DIASTOLIC ICA/CCA RATIO:    2.0 ECA:                        205cm/sec  LEFT ICA:                        114/13cm/sec CCA:                        101/8cm/sec SYSTOLIC ICA/CCA RATIO:     1.1 DIASTOLIC ICA/CCA RATIO:    1.8 ECA:                        156cm/sec  Findings:  RIGHT CAROTID ARTERY: Heterogeneous atherosclerotic plaque in the carotid bifurcation extending into the proximal internal and external carotid arteries.  Focal acentric heterogeneous plaque in the proximal internal carotid artery results of focally elevated peak systolic velocities, turbulence and spectral broadening.  RIGHT VERTEBRAL ARTERY:  Patent with normal antegrade flow.  LEFT CAROTID ARTERY: Mild heterogeneous atherosclerotic plaque in the carotid bulb extending into the proximal internal carotid artery.  No hemodynamically significant stenosis by gray scale, color Doppler, pulsed Doppler or spectral waveform analysis.  LEFT VERTEBRAL ARTERY:  Patent with normal  antegrade flow.  IMPRESSION:  1.  Heterogeneous atherosclerotic plaque results in 50 - 69% estimated diameter stenosis in the right internal carotid artery.  2.  Heterogeneous atherosclerotic plaque results in less than 50% diameter stenosis in the left internal carotid artery.  3.  Bilateral vertebral arteries are patent with antegrade flow.  Signed,  Sterling Big, MD Vascular &  Interventional Radiologist [facility] Radiology   Original Report Authenticated By: Malachy Moan, M.D.    Microbiology: Recent Results (from the past 240 hour(s))  URINE CULTURE     Status: None   Collection Time    07/18/12  5:00 PM      Result Value Range Status   Specimen Description URINE, CLEAN CATCH   Final   Special Requests NONE   Final   Culture  Setup Time 07/19/2012 00:15   Final   Colony Count >=100,000 COLONIES/ML   Final   Culture     Final   Value: ESCHERICHIA COLI     GRAM NEGATIVE RODS   Report Status PENDING   Incomplete     Labs: Basic Metabolic Panel:  Recent Labs Lab 07/18/12 1629 07/20/12 0556  NA 137 137  K 3.8 3.5  CL 99 103  CO2 30 28  GLUCOSE 163* 145*  BUN 26* 12  CREATININE 0.57 0.40*  CALCIUM 9.8 8.8   Liver Function Tests:  Recent Labs Lab 07/18/12 1629  AST 34  ALT 26  ALKPHOS 112  BILITOT 0.4  PROT 6.3  ALBUMIN 3.3*     CBC:  Recent Labs Lab 07/18/12 1629  WBC 5.9  NEUTROABS 4.0  HGB 11.9*  HCT 35.9*  MCV 90.4  PLT 243   Cardiac Enzymes:  Recent Labs Lab 07/18/12 1629  TROPONINI <0.30     CBG:  Recent Labs Lab 07/20/12 0711 07/20/12 1132 07/20/12 1641 07/20/12 2104 07/21/12 0726  GLUCAP 137* 204* 128* 153* 124*       Signed:  Adrienne Trombetta C  [facility] 07/21/2012, 11:01 AM

## 2012-07-21 NOTE — Progress Notes (Signed)
PHARMACIST - PHYSICIAN COMMUNICATION DR:   Gosrani CONCERNING: Antibiotic IV to Oral Route Change Policy  RECOMMENDATION: This patient is receiving Levaquin by the intravenous route.  Based on criteria approved by the Pharmacy and Therapeutics Committee, the antibiotic(s) is/are being converted to the equivalent oral dose form(s).  DESCRIPTION: These criteria include:  Patient being treated for a respiratory tract infection, urinary tract infection, cellulitis or clostridium difficile associated diarrhea if on metronidazole  The patient is not neutropenic and does not exhibit a GI malabsorption state  The patient is eating (either orally or via tube) and/or has been taking other orally administered medications for a least 24 hours  The patient is improving clinically and has a Tmax < 100.5  If you have questions about this conversion, please contact the Pharmacy Department  [x]  ( [phone] )  [place] []  ( [phone] )  [place]  []  ( [phone] )  [facility] []  ( [phone] )  [facility]   S. Kayson Tasker, PharmD  

## 2012-07-21 NOTE — Progress Notes (Signed)
EEG Completed; Results Pending  

## 2012-07-21 NOTE — Progress Notes (Signed)
After discussing with the family they decided that they would push the patient over via w/c.  They opt against EMS and [facility] transportation to save money.  I attempted to call report to [facility], but their phone service was out at the time.  I attempted multiple times to call.  The patient was dressed and assisted to the w/c via lift because she can not stand.  The pt was escorted to the first floor via w/c with staff in stable condition.  Loletta Specter, RN agreed with me that it would be all right to use our w/c since the patient son stated that he would return the chair.  Pt' s packet was given to the patients son and he was directed to give the packet to the admission nurse.

## 2012-10-06 ENCOUNTER — Ambulatory Visit (INDEPENDENT_AMBULATORY_CARE_PROVIDER_SITE_OTHER): Payer: Medicare HMO | Admitting: Urology

## 2012-10-06 ENCOUNTER — Other Ambulatory Visit: Payer: Self-pay | Admitting: Urology

## 2012-10-06 DIAGNOSIS — R339 Retention of urine, unspecified: Secondary | ICD-10-CM

## 2012-10-06 DIAGNOSIS — N952 Postmenopausal atrophic vaginitis: Secondary | ICD-10-CM

## 2012-10-06 DIAGNOSIS — B372 Candidiasis of skin and nail: Secondary | ICD-10-CM

## 2012-10-06 DIAGNOSIS — N3020 Other chronic cystitis without hematuria: Secondary | ICD-10-CM

## 2012-10-06 DIAGNOSIS — N8111 Cystocele, midline: Secondary | ICD-10-CM

## 2012-10-17 ENCOUNTER — Ambulatory Visit (HOSPITAL_COMMUNITY)
Admission: RE | Admit: 2012-10-17 | Discharge: 2012-10-17 | Disposition: A | Discharge status: Home / Self Care | Payer: Medicare HMO | Source: Ambulatory Visit | Attending: Urology | Admitting: Urology

## 2012-10-17 DIAGNOSIS — N200 Calculus of kidney: Secondary | ICD-10-CM | POA: Insufficient documentation

## 2012-10-17 DIAGNOSIS — N3020 Other chronic cystitis without hematuria: Secondary | ICD-10-CM

## 2012-10-17 DIAGNOSIS — E119 Type 2 diabetes mellitus without complications: Secondary | ICD-10-CM | POA: Insufficient documentation

## 2012-12-01 ENCOUNTER — Ambulatory Visit (HOSPITAL_COMMUNITY)
Admission: RE | Admit: 2012-12-01 | Discharge: 2012-12-01 | Disposition: A | Discharge status: Home / Self Care | Payer: Medicare FFS | Source: Ambulatory Visit | Attending: Urology | Admitting: Urology

## 2012-12-01 ENCOUNTER — Encounter (INDEPENDENT_AMBULATORY_CARE_PROVIDER_SITE_OTHER): Payer: Self-pay

## 2012-12-01 ENCOUNTER — Ambulatory Visit: Payer: Medicare HMO | Admitting: Urology

## 2012-12-01 ENCOUNTER — Ambulatory Visit (INDEPENDENT_AMBULATORY_CARE_PROVIDER_SITE_OTHER): Payer: Medicare HMO | Admitting: Urology

## 2012-12-01 ENCOUNTER — Other Ambulatory Visit: Payer: Self-pay | Admitting: Urology

## 2012-12-01 DIAGNOSIS — N200 Calculus of kidney: Secondary | ICD-10-CM

## 2012-12-01 DIAGNOSIS — N3941 Urge incontinence: Secondary | ICD-10-CM

## 2012-12-01 DIAGNOSIS — N3020 Other chronic cystitis without hematuria: Secondary | ICD-10-CM

## 2012-12-01 DIAGNOSIS — N8111 Cystocele, midline: Secondary | ICD-10-CM

## 2012-12-01 DIAGNOSIS — K5900 Constipation, unspecified: Secondary | ICD-10-CM | POA: Insufficient documentation

## 2012-12-20 ENCOUNTER — Other Ambulatory Visit: Payer: Self-pay | Admitting: Urology

## 2012-12-27 MED ORDER — CIPROFLOXACIN HCL 500 MG PO TABS: 500.0000 mg | ORAL_TABLET | ORAL | Status: DC

## 2012-12-27 NOTE — H&P (Signed)
tive Problems  1. Atrophic vaginitis (627.3)  2. Chronic cystitis (595.2)  3. Cutaneous candidiasis (112.3)  4. Cystocele, midline (618.01)  5. Incomplete bladder emptying (788.21)  6. Nephrolithiasis (592.0)  7. Urinary tract infection (599.0)  History of Present Illness     Nicole Williamson returns today in f/u.   She was placed on TMP for suppression.   a renal US done on 10/7 shows a non-obstructing left mid renal stone.  She has had no symptomatic UTI's but she has nocturia 3-4x.  She has some UUI.  She has no dysuria or hematuria but has daytime frequency.  She is currently on ditropan and she was placed back on Keflex earlier this week for recurrent cellulitis in the right lower leg.  She wasn't able to get a voided specimen today, but her cath UA looks infected again and she has a PVR of 175cc.  Her UUI has worsened over the last 2-3 days.   Past Medical History  1. History of A Fall (E888.9)  2. History of Anxiety (300.00)  3. History of Cellulitis Of The Leg (682.6)  4. History of diabetes mellitus (V12.29)  5. History of hypertension (V12.59)  6. History of Osteoporosis (733.00)  7. History of Urinary Tract Infection (V13.02)  Surgical History  1. History of Tonsillectomy  Current Meds  1. Aspirin 325 MG Oral Tablet;  Therapy: (Recorded:26Sep2014) to Recorded  2. Ativan 0.5 MG Oral Tablet;  Therapy: (Recorded:26Sep2014) to Recorded  3. Calcium TABS;  Therapy: (Recorded:26Sep2014) to Recorded  4. Cranberry CAPS;  Therapy: (Recorded:26Sep2014) to Recorded  5. Ditropan XL 5 MG Oral Tablet Extended Release 24 Hour;  Therapy: (Recorded:26Sep2014) to Recorded  6. Flora-Q Oral Capsule;  Therapy: (Recorded:26Sep2014) to Recorded  7. Keflex 500 MG Oral Capsule;  Therapy: (Recorded:26Sep2014) to Recorded  8. Lisinopril 10 MG Oral Tablet;  Therapy: (Recorded:26Sep2014) to Recorded  9. NovoLOG 100 UNIT/ML Subcutaneous Solution;  Therapy: (Recorded:26Sep2014) to Recorded  10.  Pro-Stat LIQD;   Therapy: (Recorded:26Sep2014) to Recorded  11. Trimethoprim 100 MG Oral Tablet;   Therapy: (Recorded:21Nov2014) to Recorded  12. Zoloft 25 MG Oral Tablet;   Therapy: (Recorded:26Sep2014) to Recorded  Allergies  1. No Known Drug Allergies  Social History  1. Denied: History of Alcohol Use  2. Marital History - Currently Married  3. Occupation: Retired   retired Engineer, site   Past and social history reviewed and updated.   Review of Systems  Genitourinary: urinary frequency, urinary urgency, nocturia and incontinence, but no dysuria.  Gastrointestinal: no diarrhea.  Constitutional: no fever.    Vitals Vital Signs [Data Includes: Last 1 Day]  Recorded: 21Nov2014 02:37PM  Blood Pressure: 140 / 40 Temperature: 97.7 F Heart Rate: 67  Results/Data Urine [Data Includes: Last 1 Day]   21Nov2014  COLOR YELLOW   APPEARANCE CLOUDY   SPECIFIC GRAVITY 1.025   pH 6.0   GLUCOSE NEG mg/dL  BILIRUBIN NEG   KETONE TRACE mg/dL  BLOOD MOD   PROTEIN 30 mg/dL  UROBILINOGEN 0.2 mg/dL  NITRITE POS   LEUKOCYTE ESTERASE LARGE   SQUAMOUS EPITHELIAL/HPF RARE   WBC TNTC WBC/hpf  RBC 7-10 RBC/hpf  BACTERIA MANY   CRYSTALS NEGATIVE   CASTS NONE SEEN    The following radiology reports were reviewed: I have reviewed the renal US report.  PVR: Cathed PVR 175 ml.    Assessment  She has a recurrent infection despite the TMP and Keflex. Her PVR is less but her UUI is worse over the last  few days. She has a left renal stone that could be an infection stone.   Plan Chronic cystitis   1. Cath for PVR/Specimen; Status:Hold For - Appointment,Date of Service; Requested  for:21Nov2014;   2. URINE CULTURE; Status:Hold For - Specimen/Data Collection,Appointment; Requested  for:21Nov2014;  Cystocele, midline   3. [facility]  Follow-up  Status: Hold For - Appointment,Date of Service   Requested for: 21Nov2014 Nephrolithiasis   4. KUB; Status:Hold For -  Appointment,Date of Service; Requested for:21Nov2014;    I am going to get a urine culture and a KUB. She may need treatment of the stone as it may be contributing to her UTI's but I will need to see if it is radiopaque before proceeding.  I am going to have her see Dr. Retta Diones for Pessary fitting.  I will change the antibiotic based on the culture.   Discussion/Summary  CC: [facility] and Dr. Carylon Perches.

## 2012-12-27 NOTE — Progress Notes (Signed)
April Roberts LPN [facility] instructed to hold pt's antibiotic's in the am, give tonight dose, Understanding verbalizied understanding, pt. To arrive at 0800.

## 2012-12-27 NOTE — Progress Notes (Signed)
Spoke with Judy Pimple at [facility] regarding pre op litho instructions which did include what medications the pt is allowed to have and not allowed to have. Harriett Sine verbalized that patients last dose of Aspirin was 12/25/12.   Harriett Sine was instructed for patient not to eat after midnight and may have clear liquids until 6am.  I instructed Harriett Sine to encourage pt to have a bedtime snack, pt is to have NO insulin am of procedure.  Judy Pimple verbalized understanding.

## 2012-12-27 NOTE — Progress Notes (Signed)
Spoke with Chasity @ [facility] re: pts's antibiotics. Pt needs to continue home antibiotics in the am & do not give cipro po per Chasity/Dr Annabell Howells. Called [facility] to inform caretaker of orders-no answer. Plan to call again in .

## 2012-12-27 NOTE — Progress Notes (Signed)
Spoke with Nicole Williamson for clarification on antibiotic's.  Pt is currently on two antibiotics at nursing facility and Cipro ordered for morning of procedure.  Nicole Williamson will speak to Dr. Annabell Howells regarding on what antibiotics pt is to take the morning of procedure.  Awaiting Nicole Williamson's return phone call.

## 2012-12-28 ENCOUNTER — Ambulatory Visit (HOSPITAL_COMMUNITY)
Admission: RE | Admit: 2012-12-28 | Discharge: 2012-12-28 | Disposition: A | Discharge status: Skilled Nursing Facility | Payer: Medicare FFS | Source: Ambulatory Visit | Attending: Urology | Admitting: Urology

## 2012-12-28 ENCOUNTER — Encounter (HOSPITAL_COMMUNITY): Payer: Self-pay

## 2012-12-28 ENCOUNTER — Encounter (HOSPITAL_COMMUNITY)
Admission: RE | Disposition: A | Discharge status: Skilled Nursing Facility | Payer: Self-pay | Source: Ambulatory Visit | Attending: Urology

## 2012-12-28 ENCOUNTER — Ambulatory Visit (HOSPITAL_COMMUNITY): Payer: Medicare FFS

## 2012-12-28 DIAGNOSIS — B372 Candidiasis of skin and nail: Secondary | ICD-10-CM | POA: Insufficient documentation

## 2012-12-28 DIAGNOSIS — N952 Postmenopausal atrophic vaginitis: Secondary | ICD-10-CM | POA: Insufficient documentation

## 2012-12-28 DIAGNOSIS — E119 Type 2 diabetes mellitus without complications: Secondary | ICD-10-CM | POA: Insufficient documentation

## 2012-12-28 DIAGNOSIS — N200 Calculus of kidney: Secondary | ICD-10-CM | POA: Diagnosis present

## 2012-12-28 DIAGNOSIS — I10 Essential (primary) hypertension: Secondary | ICD-10-CM | POA: Insufficient documentation

## 2012-12-28 DIAGNOSIS — R339 Retention of urine, unspecified: Secondary | ICD-10-CM | POA: Insufficient documentation

## 2012-12-28 DIAGNOSIS — L02419 Cutaneous abscess of limb, unspecified: Secondary | ICD-10-CM | POA: Insufficient documentation

## 2012-12-28 DIAGNOSIS — Z79899 Other long term (current) drug therapy: Secondary | ICD-10-CM | POA: Insufficient documentation

## 2012-12-28 DIAGNOSIS — N390 Urinary tract infection, site not specified: Secondary | ICD-10-CM | POA: Insufficient documentation

## 2012-12-28 DIAGNOSIS — M810 Age-related osteoporosis without current pathological fracture: Secondary | ICD-10-CM | POA: Insufficient documentation

## 2012-12-28 DIAGNOSIS — Z7982 Long term (current) use of aspirin: Secondary | ICD-10-CM | POA: Insufficient documentation

## 2012-12-28 DIAGNOSIS — Z794 Long term (current) use of insulin: Secondary | ICD-10-CM | POA: Insufficient documentation

## 2012-12-28 DIAGNOSIS — N8111 Cystocele, midline: Secondary | ICD-10-CM | POA: Insufficient documentation

## 2012-12-28 DIAGNOSIS — N3020 Other chronic cystitis without hematuria: Secondary | ICD-10-CM | POA: Diagnosis present

## 2012-12-28 HISTORY — DX: Essential (primary) hypertension: I10

## 2012-12-28 HISTORY — DX: Urinary tract infection, site not specified: N39.0

## 2012-12-28 HISTORY — DX: Calculus of kidney: N20.0

## 2012-12-28 LAB — GLUCOSE, CAPILLARY: Glucose-Capillary: 175 mg/dL — ABNORMAL HIGH (ref 70–99)

## 2012-12-28 SURGERY — LITHOTRIPSY, ESWL
Anesthesia: LOCAL | Laterality: Left

## 2012-12-28 MED ORDER — ACETAMINOPHEN 650 MG RE SUPP
650.0000 mg | RECTAL | Status: DC | PRN
  Filled 2012-12-28: qty 1

## 2012-12-28 MED ORDER — SODIUM CHLORIDE 0.9 % IJ SOLN: 3.0000 mL | Freq: Two times a day (BID) | INTRAMUSCULAR | Status: DC

## 2012-12-28 MED ORDER — ACETAMINOPHEN 325 MG PO TABS: 650.0000 mg | ORAL_TABLET | ORAL | Status: DC | PRN

## 2012-12-28 MED ORDER — ONDANSETRON HCL 4 MG/2ML IJ SOLN: 4.0000 mg | Freq: Four times a day (QID) | INTRAMUSCULAR | Status: DC | PRN

## 2012-12-28 MED ORDER — OXYCODONE HCL 5 MG PO TABS: 5.0000 mg | ORAL_TABLET | ORAL | Status: DC | PRN

## 2012-12-28 MED ORDER — ONDANSETRON HCL 4 MG PO TABS: 4.0000 mg | ORAL_TABLET | Freq: Four times a day (QID) | ORAL | Status: DC | PRN

## 2012-12-28 MED ORDER — SODIUM CHLORIDE 0.9 % IV SOLN: 250.0000 mL | INTRAVENOUS | Status: DC | PRN

## 2012-12-28 MED ORDER — DEXTROSE-NACL 5-0.45 % IV SOLN
INTRAVENOUS | Status: DC
  Administered 2012-12-28: 10:00:00 via INTRAVENOUS

## 2012-12-28 MED ORDER — DIPHENHYDRAMINE HCL 25 MG PO CAPS: 25.0000 mg | ORAL_CAPSULE | ORAL | Status: DC

## 2012-12-28 MED ORDER — FENTANYL CITRATE 0.05 MG/ML IJ SOLN: 25.0000 ug | INTRAMUSCULAR | Status: DC | PRN

## 2012-12-28 MED ORDER — HYDROCODONE-ACETAMINOPHEN 5-325 MG PO TABS: 1.0000 | ORAL_TABLET | Freq: Four times a day (QID) | ORAL | Status: DC | PRN

## 2012-12-28 MED ORDER — DIAZEPAM 5 MG PO TABS: 10.0000 mg | ORAL_TABLET | ORAL | Status: DC

## 2012-12-28 MED ORDER — SODIUM CHLORIDE 0.9 % IJ SOLN: 3.0000 mL | INTRAMUSCULAR | Status: DC | PRN

## 2012-12-28 NOTE — Progress Notes (Signed)
Patient is unable to stand or pivot well w/o max assistance and gait belt. Can not take meds unless in applesauce. Call to [facility] truck and spoke to [place]. Will hold preop po meds and will transport patient via stretcher

## 2013-01-18 ENCOUNTER — Ambulatory Visit (HOSPITAL_COMMUNITY)
Admission: RE | Admit: 2013-01-18 | Discharge: 2013-01-18 | Disposition: A | Discharge status: Home / Self Care | Payer: Medicare FFS | Source: Ambulatory Visit | Attending: Urology | Admitting: Urology

## 2013-01-18 ENCOUNTER — Other Ambulatory Visit: Payer: Self-pay | Admitting: Urology

## 2013-01-18 DIAGNOSIS — N200 Calculus of kidney: Secondary | ICD-10-CM | POA: Insufficient documentation

## 2013-01-18 DIAGNOSIS — M4120 Other idiopathic scoliosis, site unspecified: Secondary | ICD-10-CM | POA: Insufficient documentation

## 2013-02-02 ENCOUNTER — Other Ambulatory Visit: Payer: Self-pay | Admitting: Urology

## 2013-02-02 ENCOUNTER — Ambulatory Visit (INDEPENDENT_AMBULATORY_CARE_PROVIDER_SITE_OTHER): Payer: Self-pay | Admitting: Urology

## 2013-02-02 DIAGNOSIS — N200 Calculus of kidney: Secondary | ICD-10-CM

## 2013-02-02 DIAGNOSIS — N3020 Other chronic cystitis without hematuria: Secondary | ICD-10-CM

## 2013-02-20 ENCOUNTER — Ambulatory Visit (INDEPENDENT_AMBULATORY_CARE_PROVIDER_SITE_OTHER): Payer: Medicare FFS | Admitting: Urology

## 2013-02-20 DIAGNOSIS — N3020 Other chronic cystitis without hematuria: Secondary | ICD-10-CM

## 2013-02-20 DIAGNOSIS — N8110 Cystocele, unspecified: Secondary | ICD-10-CM

## 2013-02-27 ENCOUNTER — Ambulatory Visit (HOSPITAL_COMMUNITY)
Admission: RE | Admit: 2013-02-27 | Discharge: 2013-02-27 | Disposition: A | Discharge status: Home / Self Care | Payer: Medicare FFS | Source: Ambulatory Visit | Attending: Urology | Admitting: Urology

## 2013-02-27 DIAGNOSIS — N200 Calculus of kidney: Secondary | ICD-10-CM | POA: Insufficient documentation

## 2013-03-02 ENCOUNTER — Ambulatory Visit (INDEPENDENT_AMBULATORY_CARE_PROVIDER_SITE_OTHER): Payer: Self-pay | Admitting: Urology

## 2013-03-02 ENCOUNTER — Other Ambulatory Visit: Payer: Self-pay | Admitting: Urology

## 2013-03-02 DIAGNOSIS — N3020 Other chronic cystitis without hematuria: Secondary | ICD-10-CM

## 2013-03-02 DIAGNOSIS — N200 Calculus of kidney: Secondary | ICD-10-CM

## 2013-05-28 ENCOUNTER — Ambulatory Visit (HOSPITAL_COMMUNITY)
Admission: RE | Admit: 2013-05-28 | Discharge: 2013-05-28 | Disposition: A | Discharge status: Home / Self Care | Payer: Medicare FFS | Source: Ambulatory Visit | Attending: Urology | Admitting: Urology

## 2013-05-28 ENCOUNTER — Other Ambulatory Visit: Payer: Self-pay | Admitting: Urology

## 2013-05-28 DIAGNOSIS — M47817 Spondylosis without myelopathy or radiculopathy, lumbosacral region: Secondary | ICD-10-CM | POA: Insufficient documentation

## 2013-05-28 DIAGNOSIS — N200 Calculus of kidney: Secondary | ICD-10-CM

## 2013-06-01 ENCOUNTER — Ambulatory Visit (INDEPENDENT_AMBULATORY_CARE_PROVIDER_SITE_OTHER): Payer: Medicare FFS | Admitting: Urology

## 2013-06-01 DIAGNOSIS — N200 Calculus of kidney: Secondary | ICD-10-CM

## 2013-06-01 DIAGNOSIS — N3020 Other chronic cystitis without hematuria: Secondary | ICD-10-CM

## 2013-06-19 ENCOUNTER — Ambulatory Visit (INDEPENDENT_AMBULATORY_CARE_PROVIDER_SITE_OTHER): Payer: Medicare FFS | Admitting: Urology

## 2013-06-19 DIAGNOSIS — N8110 Cystocele, unspecified: Secondary | ICD-10-CM

## 2013-09-27 ENCOUNTER — Encounter (HOSPITAL_COMMUNITY): Payer: Self-pay | Admitting: Emergency Medicine

## 2013-09-27 ENCOUNTER — Emergency Department (HOSPITAL_COMMUNITY): Payer: Medicare FFS

## 2013-09-27 ENCOUNTER — Inpatient Hospital Stay (HOSPITAL_COMMUNITY)
Admission: EM | Admit: 2013-09-27 | Discharge: 2013-10-01 | DRG: 689 | Disposition: A | Discharge status: Skilled Nursing Facility | Payer: Medicare FFS | Attending: Internal Medicine | Admitting: Internal Medicine

## 2013-09-27 DIAGNOSIS — B9689 Other specified bacterial agents as the cause of diseases classified elsewhere: Secondary | ICD-10-CM | POA: Diagnosis present

## 2013-09-27 DIAGNOSIS — Z87442 Personal history of urinary calculi: Secondary | ICD-10-CM

## 2013-09-27 DIAGNOSIS — B964 Proteus (mirabilis) (morganii) as the cause of diseases classified elsewhere: Secondary | ICD-10-CM | POA: Diagnosis present

## 2013-09-27 DIAGNOSIS — D649 Anemia, unspecified: Secondary | ICD-10-CM | POA: Diagnosis present

## 2013-09-27 DIAGNOSIS — Z6833 Body mass index (BMI) 33.0-33.9, adult: Secondary | ICD-10-CM

## 2013-09-27 DIAGNOSIS — R079 Chest pain, unspecified: Secondary | ICD-10-CM | POA: Diagnosis not present

## 2013-09-27 DIAGNOSIS — Z794 Long term (current) use of insulin: Secondary | ICD-10-CM

## 2013-09-27 DIAGNOSIS — N200 Calculus of kidney: Secondary | ICD-10-CM

## 2013-09-27 DIAGNOSIS — F411 Generalized anxiety disorder: Secondary | ICD-10-CM | POA: Diagnosis present

## 2013-09-27 DIAGNOSIS — I10 Essential (primary) hypertension: Secondary | ICD-10-CM | POA: Diagnosis present

## 2013-09-27 DIAGNOSIS — Z7401 Bed confinement status: Secondary | ICD-10-CM

## 2013-09-27 DIAGNOSIS — E119 Type 2 diabetes mellitus without complications: Secondary | ICD-10-CM | POA: Diagnosis present

## 2013-09-27 DIAGNOSIS — M129 Arthropathy, unspecified: Secondary | ICD-10-CM | POA: Diagnosis present

## 2013-09-27 DIAGNOSIS — F419 Anxiety disorder, unspecified: Secondary | ICD-10-CM | POA: Diagnosis present

## 2013-09-27 DIAGNOSIS — R7881 Bacteremia: Secondary | ICD-10-CM | POA: Diagnosis present

## 2013-09-27 DIAGNOSIS — Z7982 Long term (current) use of aspirin: Secondary | ICD-10-CM

## 2013-09-27 DIAGNOSIS — Z993 Dependence on wheelchair: Secondary | ICD-10-CM

## 2013-09-27 DIAGNOSIS — J189 Pneumonia, unspecified organism: Secondary | ICD-10-CM | POA: Diagnosis present

## 2013-09-27 DIAGNOSIS — R509 Fever, unspecified: Secondary | ICD-10-CM | POA: Diagnosis not present

## 2013-09-27 DIAGNOSIS — E876 Hypokalemia: Secondary | ICD-10-CM | POA: Diagnosis present

## 2013-09-27 DIAGNOSIS — N3020 Other chronic cystitis without hematuria: Secondary | ICD-10-CM

## 2013-09-27 DIAGNOSIS — N390 Urinary tract infection, site not specified: Secondary | ICD-10-CM | POA: Diagnosis not present

## 2013-09-27 HISTORY — DX: Bacteremia: R78.81

## 2013-09-27 HISTORY — DX: Urinary tract infection, site not specified: N39.0

## 2013-09-27 LAB — CBC WITH DIFFERENTIAL/PLATELET
Basophils Absolute: 0 10*3/uL (ref 0.0–0.1)
Basophils Relative: 0 % (ref 0–1)
Eosinophils Absolute: 0 10*3/uL (ref 0.0–0.7)
Eosinophils Relative: 0 % (ref 0–5)
HCT: 37.2 % (ref 36.0–46.0)
Hemoglobin: 12.6 g/dL (ref 12.0–15.0)
LYMPHS ABS: 0.7 10*3/uL (ref 0.7–4.0)
LYMPHS PCT: 7 % — AB (ref 12–46)
MCH: 30.3 pg (ref 26.0–34.0)
MCHC: 33.9 g/dL (ref 30.0–36.0)
MCV: 89.4 fL (ref 78.0–100.0)
Monocytes Absolute: 0.5 10*3/uL (ref 0.1–1.0)
Monocytes Relative: 5 % (ref 3–12)
NEUTROS PCT: 88 % — AB (ref 43–77)
Neutro Abs: 8.7 10*3/uL — ABNORMAL HIGH (ref 1.7–7.7)
PLATELETS: 182 10*3/uL (ref 150–400)
RBC: 4.16 MIL/uL (ref 3.87–5.11)
RDW: 13 % (ref 11.5–15.5)
WBC: 9.9 10*3/uL (ref 4.0–10.5)

## 2013-09-27 LAB — BASIC METABOLIC PANEL
Anion gap: 15 (ref 5–15)
BUN: 16 mg/dL (ref 6–23)
CO2: 24 meq/L (ref 19–32)
Calcium: 9.1 mg/dL (ref 8.4–10.5)
Chloride: 97 mEq/L (ref 96–112)
Creatinine, Ser: 0.64 mg/dL (ref 0.50–1.10)
GFR calc Af Amer: >90 mL/min (ref >90)
GFR calc non Af Amer: 82 mL/min — ABNORMAL LOW (ref >90)
GLUCOSE: 298 mg/dL — AB (ref 70–99)
POTASSIUM: 3.7 meq/L (ref 3.7–5.3)
Sodium: 136 mEq/L — ABNORMAL LOW (ref 137–147)

## 2013-09-27 LAB — LACTIC ACID, PLASMA: LACTIC ACID, VENOUS: 1.8 mmol/L (ref 0.5–2.2)

## 2013-09-27 LAB — HEPATIC FUNCTION PANEL
ALBUMIN: 3.3 g/dL — AB (ref 3.5–5.2)
ALT: 28 U/L (ref 0–35)
AST: 27 U/L (ref 0–37)
Alkaline Phosphatase: 129 U/L — ABNORMAL HIGH (ref 39–117)
Bilirubin, Direct: <0.2 mg/dL (ref 0.0–0.3)
TOTAL PROTEIN: 6.8 g/dL (ref 6.0–8.3)
Total Bilirubin: 0.6 mg/dL (ref 0.3–1.2)

## 2013-09-27 LAB — URINALYSIS, ROUTINE W REFLEX MICROSCOPIC
Bilirubin Urine: NEGATIVE
Glucose, UA: >1000 mg/dL — AB
Ketones, ur: 15 mg/dL — AB
Nitrite: NEGATIVE
PROTEIN: NEGATIVE mg/dL
Specific Gravity, Urine: 1.01 (ref 1.005–1.030)
UROBILINOGEN UA: 0.2 mg/dL (ref 0.0–1.0)
pH: 7.5 (ref 5.0–8.0)

## 2013-09-27 LAB — URINE MICROSCOPIC-ADD ON

## 2013-09-27 LAB — CBG MONITORING, ED: GLUCOSE-CAPILLARY: 272 mg/dL — AB (ref 70–99)

## 2013-09-27 MED ORDER — ACETAMINOPHEN 325 MG PO TABS
650.0000 mg | ORAL_TABLET | Freq: Once | ORAL | Status: AC
  Administered 2013-09-27: 650 mg via ORAL
  Filled 2013-09-27: qty 2

## 2013-09-27 NOTE — ED Provider Notes (Signed)
CSN: [id number]     Arrival date & time 09/27/13  2204 History  This chart was scribed for Nicole Lennert, MD by Tonye Royalty, ED Scribe. This patient was seen in room APA06/APA06 and the patient's care was started at 11:04 PM.    Chief Complaint  Patient presents with  . Hyperglycemia   Patient is a 80 y.o. female presenting with fever. The history is provided by the nursing home (pt is confused and has a fever). No language interpreter was used.  Fever Temp source:  Oral Severity:  Moderate Timing:  Constant Progression:  Unable to specify Associated symptoms: cough     HPI Comments: Nicole Williamson is a 80 y.o. female who presents to the Emergency Department complaining of hyperglycemia. She reports associated cough and numbness in her fingers. Her temperature measures here at 102.1. Patient is a poor historian due to altered mental status.  Past Medical History  Diagnosis Date  . Diabetes mellitus without complication   . Arthritis   . Hypertension   . UTI (urinary tract infection)   . Renal stone     left   Past Surgical History  Procedure Laterality Date  . Tonsillectomy     History reviewed. No pertinent family history. History  Substance Use Topics  . Smoking status: Never Smoker   . Smokeless tobacco: Not on file  . Alcohol Use: No   OB History   Grav Para Term Preterm Abortions TAB SAB Ect Mult Living                 Review of Systems  Unable to perform ROS: Mental status change  Constitutional: Positive for fever.  Respiratory: Positive for cough.   Neurological: Positive for numbness.  Level 5 caveat due to altered mental status  Allergies  Review of patient's allergies indicates no known allergies.  Home Medications   Prior to Admission medications   Medication Sig Start Date End Date Taking? Authorizing Provider  acetaminophen (TYLENOL) 650 MG CR tablet Take 650 mg by mouth 2 (two) times daily.   Yes Historical Provider, MD  aspirin 325 MG tablet  Take 1 tablet (325 mg total) by mouth daily. 07/21/12  Yes Nimish Normajean Glasgow, MD  CRANBERRY PO Take by mouth.   Yes Historical Provider, MD  ferrous fumarate (HEMOCYTE - 106 MG FE) 325 (106 FE) MG TABS tablet Take 1 tablet by mouth 2 (two) times daily.   Yes Historical Provider, MD  HYDROcodone-acetaminophen (NORCO/VICODIN) 5-325 MG per tablet Take 1 tablet by mouth every 4 (four) hours as needed for moderate pain.   Yes Historical Provider, MD  insulin aspart (NOVOLOG) 100 UNIT/ML injection Inject 2-8 Units into the skin 3 (three) times daily before meals. SSI with meals and at bedtime.   Yes Historical Provider, MD  linezolid (ZYVOX) 600 MG tablet Take 600 mg by mouth 2 (two) times daily. Starting 09/20/2013 x 10 days.   Yes Historical Provider, MD  lisinopril (PRINIVIL,ZESTRIL) 10 MG tablet Take 10 mg by mouth daily.   Yes Historical Provider, MD  LORazepam (ATIVAN) 0.5 MG tablet Take 0.5 mg by mouth 2 (two) times daily.    Yes Historical Provider, MD  metFORMIN (GLUCOPHAGE) 500 MG tablet Take 1,000 mg by mouth 2 (two) times daily with a meal.   Yes Historical Provider, MD  oxybutynin (DITROPAN XL) 15 MG 24 hr tablet Take 15 mg by mouth every evening.   Yes Historical Provider, MD  polyethylene glycol (MIRALAX / GLYCOLAX) packet  Take 17 g by mouth daily.   Yes Historical Provider, MD  senna (SENOKOT) 8.6 MG TABS tablet Take 1 tablet by mouth 2 (two) times daily.   Yes Historical Provider, MD  sertraline (ZOLOFT) 25 MG tablet Take 25 mg by mouth daily.   Yes Historical Provider, MD  vitamin C (ASCORBIC ACID) 500 MG tablet Take 500 mg by mouth 2 (two) times daily.   Yes Historical Provider, MD   BP 123/59  Pulse 82  Temp(Src) 102.1 F (38.9 C) (Rectal)  Resp 18  Wt 200 lb (90.719 kg)  SpO2 99% Physical Exam  Nursing note and vitals reviewed. Constitutional: She appears well-developed.  HENT:  Head: Normocephalic.  Dry mucous membranes  Eyes: Conjunctivae and EOM are normal. No scleral  icterus.  Neck: Neck supple. No thyromegaly present.  Cardiovascular: Normal rate and regular rhythm.  Exam reveals no gallop and no friction rub.   No murmur heard. Pulmonary/Chest: No stridor. She has no wheezes. She has no rales. She exhibits no tenderness.  Abdominal: She exhibits no distension. There is no tenderness. There is no rebound.  Musculoskeletal: Normal range of motion. She exhibits no edema.  Lymphadenopathy:    She has no cervical adenopathy.  Neurological: She exhibits normal muscle tone. Coordination normal.  Oriented to person only  Skin: No rash noted. No erythema.  Psychiatric: She has a normal mood and affect. Her behavior is normal.    ED Course  Procedures (including critical care time) Labs Review Labs Reviewed  CBG MONITORING, ED - Abnormal; Notable for the following:    Glucose-Capillary 272 (*)    All other components within normal limits  URINE CULTURE  CULTURE, BLOOD (ROUTINE X 2)  CULTURE, BLOOD (ROUTINE X 2)  URINALYSIS, ROUTINE W REFLEX MICROSCOPIC  CBC WITH DIFFERENTIAL  BASIC METABOLIC PANEL  I-STAT CG4 LACTIC ACID, ED    Imaging Review No results found.   EKG Interpretation None     DIAGNOSTIC STUDIES: Oxygen Saturation is 100% on room air, normal by my interpretation.    COORDINATION OF CARE:    MDM   Final diagnoses:  None   uti admit The chart was scribed for me under my direct supervision.  I personally performed the history, physical, and medical decision making and all procedures in the evaluation of this patient.Nicole Lennert, MD 09/28/13 [id number]

## 2013-09-27 NOTE — ED Notes (Signed)
Patient via [facility] c/o right hand pain. Upon arrival patient is A&OX4. Patient is tearful and voicing concern about her family. Patient states she is having pain to the right hand.

## 2013-09-27 NOTE — ED Notes (Signed)
Pt is resident of [facility] who reportedly is somewhat confused tonight, has low-grade fever, is on antibiotics for ?? Uti.  Pt currently is awake and alert, appears nad

## 2013-09-28 ENCOUNTER — Encounter (HOSPITAL_COMMUNITY): Payer: Self-pay | Admitting: Internal Medicine

## 2013-09-28 DIAGNOSIS — Z6833 Body mass index (BMI) 33.0-33.9, adult: Secondary | ICD-10-CM | POA: Diagnosis not present

## 2013-09-28 DIAGNOSIS — I10 Essential (primary) hypertension: Secondary | ICD-10-CM | POA: Diagnosis present

## 2013-09-28 DIAGNOSIS — F411 Generalized anxiety disorder: Secondary | ICD-10-CM | POA: Diagnosis present

## 2013-09-28 DIAGNOSIS — Z794 Long term (current) use of insulin: Secondary | ICD-10-CM | POA: Diagnosis not present

## 2013-09-28 DIAGNOSIS — J189 Pneumonia, unspecified organism: Secondary | ICD-10-CM | POA: Diagnosis present

## 2013-09-28 DIAGNOSIS — B9689 Other specified bacterial agents as the cause of diseases classified elsewhere: Secondary | ICD-10-CM | POA: Diagnosis present

## 2013-09-28 DIAGNOSIS — M129 Arthropathy, unspecified: Secondary | ICD-10-CM | POA: Diagnosis present

## 2013-09-28 DIAGNOSIS — E119 Type 2 diabetes mellitus without complications: Secondary | ICD-10-CM | POA: Diagnosis present

## 2013-09-28 DIAGNOSIS — N390 Urinary tract infection, site not specified: Principal | ICD-10-CM

## 2013-09-28 DIAGNOSIS — Z993 Dependence on wheelchair: Secondary | ICD-10-CM | POA: Diagnosis not present

## 2013-09-28 DIAGNOSIS — Z87442 Personal history of urinary calculi: Secondary | ICD-10-CM | POA: Diagnosis not present

## 2013-09-28 DIAGNOSIS — E876 Hypokalemia: Secondary | ICD-10-CM | POA: Diagnosis present

## 2013-09-28 DIAGNOSIS — R509 Fever, unspecified: Secondary | ICD-10-CM | POA: Diagnosis present

## 2013-09-28 DIAGNOSIS — D649 Anemia, unspecified: Secondary | ICD-10-CM | POA: Diagnosis present

## 2013-09-28 DIAGNOSIS — Z7982 Long term (current) use of aspirin: Secondary | ICD-10-CM | POA: Diagnosis not present

## 2013-09-28 DIAGNOSIS — Z7401 Bed confinement status: Secondary | ICD-10-CM | POA: Diagnosis not present

## 2013-09-28 DIAGNOSIS — B964 Proteus (mirabilis) (morganii) as the cause of diseases classified elsewhere: Secondary | ICD-10-CM | POA: Diagnosis present

## 2013-09-28 LAB — GLUCOSE, CAPILLARY
GLUCOSE-CAPILLARY: 234 mg/dL — AB (ref 70–99)
GLUCOSE-CAPILLARY: 168 mg/dL — AB (ref 70–99)
Glucose-Capillary: 237 mg/dL — ABNORMAL HIGH (ref 70–99)
Glucose-Capillary: 248 mg/dL — ABNORMAL HIGH (ref 70–99)
Glucose-Capillary: 234 mg/dL — ABNORMAL HIGH (ref 70–99)

## 2013-09-28 LAB — COMPREHENSIVE METABOLIC PANEL
ALT: 24 U/L (ref 0–35)
AST: 25 U/L (ref 0–37)
Albumin: 2.8 g/dL — ABNORMAL LOW (ref 3.5–5.2)
Alkaline Phosphatase: 116 U/L (ref 39–117)
Anion gap: 12 (ref 5–15)
BILIRUBIN TOTAL: 0.5 mg/dL (ref 0.3–1.2)
BUN: 12 mg/dL (ref 6–23)
CALCIUM: 8.6 mg/dL (ref 8.4–10.5)
CO2: 24 meq/L (ref 19–32)
CREATININE: 0.48 mg/dL — AB (ref 0.50–1.10)
Chloride: 102 mEq/L (ref 96–112)
GFR calc Af Amer: >90 mL/min (ref >90)
GLUCOSE: 244 mg/dL — AB (ref 70–99)
Potassium: 3.5 mEq/L — ABNORMAL LOW (ref 3.7–5.3)
Sodium: 138 mEq/L (ref 137–147)
Total Protein: 6.1 g/dL (ref 6.0–8.3)

## 2013-09-28 LAB — CBC
HCT: 34.5 % — ABNORMAL LOW (ref 36.0–46.0)
Hemoglobin: 11.7 g/dL — ABNORMAL LOW (ref 12.0–15.0)
MCH: 30.2 pg (ref 26.0–34.0)
MCHC: 33.9 g/dL (ref 30.0–36.0)
MCV: 89.1 fL (ref 78.0–100.0)
Platelets: 180 10*3/uL (ref 150–400)
RBC: 3.87 MIL/uL (ref 3.87–5.11)
RDW: 13.1 % (ref 11.5–15.5)
WBC: 7.5 10*3/uL (ref 4.0–10.5)

## 2013-09-28 LAB — MRSA PCR SCREENING: MRSA BY PCR: NEGATIVE

## 2013-09-28 MED ORDER — POTASSIUM CHLORIDE CRYS ER 20 MEQ PO TBCR
20.0000 meq | EXTENDED_RELEASE_TABLET | Freq: Every day | ORAL | Status: DC
  Administered 2013-09-28 – 2013-09-29 (×2): 20 meq via ORAL
  Filled 2013-09-28 (×2): qty 1

## 2013-09-28 MED ORDER — HYDROCODONE-ACETAMINOPHEN 5-325 MG PO TABS
1.0000 | ORAL_TABLET | ORAL | Status: DC | PRN
  Administered 2013-09-29: 1 via ORAL
  Filled 2013-09-28: qty 1

## 2013-09-28 MED ORDER — METFORMIN HCL 500 MG PO TABS
1000.0000 mg | ORAL_TABLET | Freq: Two times a day (BID) | ORAL | Status: DC
  Administered 2013-09-28 – 2013-10-01 (×7): 1000 mg via ORAL
  Filled 2013-09-28 (×7): qty 2

## 2013-09-28 MED ORDER — INSULIN ASPART 100 UNIT/ML ~~LOC~~ SOLN: 0.0000 [IU] | Freq: Every day | SUBCUTANEOUS | Status: DC

## 2013-09-28 MED ORDER — POLYETHYLENE GLYCOL 3350 17 G PO PACK
17.0000 g | PACK | Freq: Every day | ORAL | Status: DC
  Administered 2013-09-28 – 2013-09-30 (×2): 17 g via ORAL
  Filled 2013-09-28 (×3): qty 1

## 2013-09-28 MED ORDER — LISINOPRIL 10 MG PO TABS
10.0000 mg | ORAL_TABLET | Freq: Every day | ORAL | Status: DC
  Administered 2013-09-28 – 2013-10-01 (×4): 10 mg via ORAL
  Filled 2013-09-28 (×4): qty 1

## 2013-09-28 MED ORDER — FERROUS FUMARATE 325 (106 FE) MG PO TABS
1.0000 | ORAL_TABLET | Freq: Two times a day (BID) | ORAL | Status: DC
  Administered 2013-09-28 – 2013-10-01 (×7): 106 mg via ORAL
  Filled 2013-09-28 (×7): qty 1

## 2013-09-28 MED ORDER — LEVOFLOXACIN IN D5W 750 MG/150ML IV SOLN
750.0000 mg | Freq: Every day | INTRAVENOUS | Status: DC
  Administered 2013-09-28 – 2013-09-29 (×2): 750 mg via INTRAVENOUS
  Filled 2013-09-28 (×2): qty 150

## 2013-09-28 MED ORDER — ONDANSETRON HCL 4 MG PO TABS: 4.0000 mg | ORAL_TABLET | Freq: Four times a day (QID) | ORAL | Status: DC | PRN

## 2013-09-28 MED ORDER — MORPHINE SULFATE 2 MG/ML IJ SOLN
2.0000 mg | INTRAMUSCULAR | Status: DC | PRN
  Administered 2013-09-28 – 2013-09-30 (×5): 2 mg via INTRAVENOUS
  Filled 2013-09-28 (×5): qty 1

## 2013-09-28 MED ORDER — VITAMIN C 500 MG PO TABS
500.0000 mg | ORAL_TABLET | Freq: Two times a day (BID) | ORAL | Status: DC
  Administered 2013-09-28 – 2013-10-01 (×7): 500 mg via ORAL
  Filled 2013-09-28 (×7): qty 1

## 2013-09-28 MED ORDER — SENNA 8.6 MG PO TABS
1.0000 | ORAL_TABLET | Freq: Two times a day (BID) | ORAL | Status: DC
Start: 2013-09-28 — End: 2013-10-01
  Administered 2013-09-28 – 2013-09-30 (×6): 8.6 mg via ORAL
  Filled 2013-09-28 (×6): qty 1

## 2013-09-28 MED ORDER — HEPARIN SODIUM (PORCINE) 5000 UNIT/ML IJ SOLN
5000.0000 [IU] | Freq: Three times a day (TID) | INTRAMUSCULAR | Status: DC
  Administered 2013-09-28 – 2013-10-01 (×11): 5000 [IU] via SUBCUTANEOUS
  Filled 2013-09-28 (×11): qty 1

## 2013-09-28 MED ORDER — INSULIN ASPART 100 UNIT/ML ~~LOC~~ SOLN
0.0000 [IU] | Freq: Three times a day (TID) | SUBCUTANEOUS | Status: DC
  Administered 2013-09-28 (×3): 3 [IU] via SUBCUTANEOUS
  Administered 2013-09-29: 1 [IU] via SUBCUTANEOUS
  Administered 2013-09-29: 3 [IU] via SUBCUTANEOUS
  Administered 2013-09-29: 2 [IU] via SUBCUTANEOUS
  Administered 2013-09-30: 3 [IU] via SUBCUTANEOUS
  Administered 2013-10-01 (×2): 1 [IU] via SUBCUTANEOUS

## 2013-09-28 MED ORDER — MORPHINE SULFATE 2 MG/ML IJ SOLN: 2.0000 mg | INTRAMUSCULAR | Status: DC | PRN

## 2013-09-28 MED ORDER — INSULIN DETEMIR 100 UNIT/ML ~~LOC~~ SOLN
7.0000 [IU] | Freq: Two times a day (BID) | SUBCUTANEOUS | Status: DC
  Administered 2013-09-28 – 2013-09-29 (×3): 7 [IU] via SUBCUTANEOUS
  Filled 2013-09-28 (×7): qty 0.07

## 2013-09-28 MED ORDER — LEVOFLOXACIN IN D5W 500 MG/100ML IV SOLN
500.0000 mg | Freq: Once | INTRAVENOUS | Status: AC
  Administered 2013-09-28: 500 mg via INTRAVENOUS
  Filled 2013-09-28: qty 100

## 2013-09-28 MED ORDER — ASPIRIN 325 MG PO TABS
325.0000 mg | ORAL_TABLET | Freq: Every day | ORAL | Status: DC
  Administered 2013-09-28 – 2013-10-01 (×4): 325 mg via ORAL
  Filled 2013-09-28 (×4): qty 1

## 2013-09-28 MED ORDER — ONDANSETRON HCL 4 MG/2ML IJ SOLN: 4.0000 mg | Freq: Four times a day (QID) | INTRAMUSCULAR | Status: DC | PRN

## 2013-09-28 MED ORDER — OXYBUTYNIN CHLORIDE ER 5 MG PO TB24
15.0000 mg | ORAL_TABLET | Freq: Every evening | ORAL | Status: DC
  Administered 2013-09-28 – 2013-09-30 (×3): 15 mg via ORAL
  Filled 2013-09-28 (×3): qty 3

## 2013-09-28 MED ORDER — VANCOMYCIN HCL IN DEXTROSE 1-5 GM/200ML-% IV SOLN
1000.0000 mg | Freq: Two times a day (BID) | INTRAVENOUS | Status: DC
  Administered 2013-09-28 – 2013-10-01 (×7): 1000 mg via INTRAVENOUS
  Filled 2013-09-28 (×9): qty 200

## 2013-09-28 MED ORDER — ACETAMINOPHEN 325 MG PO TABS
325.0000 mg | ORAL_TABLET | Freq: Four times a day (QID) | ORAL | Status: DC
Start: 2013-09-28 — End: 2013-10-01
  Administered 2013-09-28 – 2013-10-01 (×14): 325 mg via ORAL
  Filled 2013-09-28 (×14): qty 1

## 2013-09-28 NOTE — Progress Notes (Signed)
The patient is an 80 year old woman with a history of recurrent urinary tract infections, hypertension, and type 2 diabetes mellitus, who was admitted early this morning by Dr. Conley Rolls. She was briefly seen and examined. Her chart, laboratory studies, and vital signs were reviewed. We'll continue current treatment, but will add vancomycin to Levaquin for HCAP treatment. We'll add potassium chloride supplementation for mild hypokalemia. Will order PT evaluation for tomorrow morning. We'll order hemoglobin A1c and TSH. Continue supportive treatment otherwise.

## 2013-09-28 NOTE — Progress Notes (Addendum)
ANTIBIOTIC CONSULT NOTE - INITIAL  Pharmacy Consult for Vancomycin  Indication: rule out sepsis, PNA  No Known Allergies  Patient Measurements: Height:  (165.1 cm) Weight: 198 lb 11.2 oz (90.13 kg) IBW/kg (Calculated) : 57  Vital Signs: Temp: 100.3 F (37.9 C) (09/18 0544) Temp src: Axillary (09/18 0544) BP: 136/44 mmHg (09/18 0902) Pulse Rate: 83 (09/18 0544) Intake/Output from previous day:   Intake/Output from this shift: Total I/O In: 120 [P.O.:120] Out: -   Labs:  Recent Labs  09/27/13 2242 09/28/13 0609  WBC 9.9 7.5  HGB 12.6 11.7*  PLT 182 180  CREATININE 0.64 0.48*   Estimated Creatinine Clearance: 62.2 ml/min (by C-G formula based on Cr of 0.48). No results found for this basename: VANCOTROUGH, VANCOPEAK, VANCORANDOM, GENTTROUGH, GENTPEAK, GENTRANDOM, TOBRATROUGH, TOBRAPEAK, TOBRARND, AMIKACINPEAK, AMIKACINTROU, AMIKACIN,  in the last 72 hours   Microbiology: Recent Results (from the past 720 hour(s))  CULTURE, BLOOD (ROUTINE X 2)     Status: None   Collection Time    09/27/13 10:42 PM      Result Value Ref Range Status   Specimen Description BLOOD LEFT ARM   Final   Special Requests     Final   Value: BOTTLES DRAWN AEROBIC AND ANAEROBIC AEB 12CC ANA 10CC   Culture PENDING   Incomplete   Report Status PENDING   Incomplete  CULTURE, BLOOD (ROUTINE X 2)     Status: None   Collection Time    09/27/13 10:53 PM      Result Value Ref Range Status   Specimen Description BLOOD LEFT ARM   Final   Special Requests BOTTLES DRAWN AEROBIC ONLY 6CC   Final   Culture PENDING   Incomplete   Report Status PENDING   Incomplete    Medical History: Past Medical History  Diagnosis Date  . Diabetes mellitus without complication   . Arthritis   . Hypertension   . UTI (urinary tract infection)   . Renal stone     left    Medications:  Scheduled:  . acetaminophen  325 mg Oral QID  . aspirin  325 mg Oral Daily  . ferrous fumarate  1 tablet Oral BID WC   . heparin  5,000 Units Subcutaneous 3 times per day  . insulin aspart  0-5 Units Subcutaneous QHS  . insulin aspart  0-9 Units Subcutaneous TID WC  . insulin detemir  7 Units Subcutaneous BID  . levofloxacin (LEVAQUIN) IV  750 mg Intravenous QHS  . lisinopril  10 mg Oral Daily  . metFORMIN  1,000 mg Oral BID WC  . oxybutynin  15 mg Oral QPM  . polyethylene glycol  17 g Oral Daily  . potassium chloride  20 mEq Oral Daily  . senna  1 tablet Oral BID  . vancomycin  1,000 mg Intravenous Q12H  . vitamin C  500 mg Oral BID   Assessment: 80 yo F from NH with hx chronic UTIs presented to ED with fever 102 F and hyperglycemia.  She is currently completing a 10 day course of Zyvox for UTI at [facility].   CXR also + for possible PNA on admission.  Antibiotic coverage being broadened.   Renal function is at patient's baseline.   Levaquin 9/18>> Vancomycin 9/18>> Zyvox 9/10>>9/18  Goal of Therapy:  Vancomycin trough level 15-20 mcg/ml  Plan:  Vancomycin  IV q12h Continue Levaquin  IV q24h Check Vancomycin trough at steady state Monitor renal function and cx data   Kabeer Hoagland,  [facility] Riding 09/28/2013,10:43 AM

## 2013-09-28 NOTE — H&P (Signed)
[facility] History and Physical  Nicole Williamson JXB:[id number] DOB: 03-26-1931    PCP:   No PCP Per Patient   Chief Complaint: sent in for elevated BS.  She was found to have a UTI and possibly a PNA as well.  HPI: Nicole Williamson is an 80 y.o. female with hx of DM, HTN, chronic cystitis, left kidney stone, brought to the ER from SNF as her BS was elevated at 300's per EMS.  She was alert and lucid when I saw her, saying she had a UTI being treated at the "nursing home".  Evalatuion in the ER showed BS was in the 200's.  She was also found to have a fever with a Temp of 102 in the ER.  She denied any chills, SOB, coughs, nausea, vomiting or abdominal pain.  Further evaluation included a CXR which showed a density reflective of either atelectasis or small PNA.  Her UA showed many bacteria and 11-20 WBCs.  She also has a normal WBC, HB of 12 g per dL, and normal lactic acid. Her hemodynamics were stable.  She was able to tell me where she was, her daughter and her husband's names correctly, and was lucid to me.  After urine and blood cultures were obtained, hospitalist was asked to admit her for sepsis due to UTI, and possibly an early PNA as well, though less likely.    Rewiew of Systems:  Constitutional: Negative for malaise, fever and chills. No significant weight loss or weight gain Eyes: Negative for eye pain, redness and discharge, diplopia, visual changes, or flashes of light. ENMT: Negative for ear pain, hoarseness, nasal congestion, sinus pressure and sore throat. No headaches; tinnitus, drooling, or problem swallowing. Cardiovascular: Negative for chest pain, palpitations, diaphoresis, dyspnea and peripheral edema. ; No orthopnea, PND Respiratory: Negative for cough, hemoptysis, wheezing and stridor. No pleuritic chestpain. Gastrointestinal: Negative for nausea, vomiting, diarrhea, constipation, abdominal pain, melena, blood in stool, hematemesis, jaundice and rectal bleeding.     Genitourinary: Negative for frequency, dysuria, incontinence,flank pain and hematuria; Musculoskeletal: Negative for back pain and neck pain. Negative for swelling and trauma.;  Skin: . Negative for pruritus, rash, abrasions, bruising and skin lesion.; ulcerations Neuro: Negative for headache, lightheadedness and neck stiffness. Negative for weakness, altered level of consciousness , altered mental status, extremity weakness, burning feet, involuntary movement, seizure and syncope.  Psych: negative for anxiety, depression, insomnia, tearfulness, panic attacks, hallucinations, paranoia, suicidal or homicidal ideation    Past Medical History  Diagnosis Date  . Diabetes mellitus without complication   . Arthritis   . Hypertension   . UTI (urinary tract infection)   . Renal stone     left    Past Surgical History  Procedure Laterality Date  . Tonsillectomy      Medications:  HOME MEDS: Prior to Admission medications   Medication Sig Start Date End Date Taking? Authorizing Provider  acetaminophen (TYLENOL) 650 MG CR tablet Take 650 mg by mouth 2 (two) times daily.   Yes Historical Provider, MD  aspirin 325 MG tablet Take 1 tablet (325 mg total) by mouth daily. 07/21/12  Yes Nimish Normajean Glasgow, MD  CRANBERRY PO Take by mouth.   Yes Historical Provider, MD  ferrous fumarate (HEMOCYTE - 106 MG FE) 325 (106 FE) MG TABS tablet Take 1 tablet by mouth 2 (two) times daily.   Yes Historical Provider, MD  HYDROcodone-acetaminophen (NORCO/VICODIN) 5-325 MG per tablet Take 1 tablet by mouth every 4 (four) hours as needed for  moderate pain.   Yes Historical Provider, MD  insulin aspart (NOVOLOG) 100 UNIT/ML injection Inject 2-8 Units into the skin 3 (three) times daily before meals. SSI with meals and at bedtime.   Yes Historical Provider, MD  linezolid (ZYVOX) 600 MG tablet Take 600 mg by mouth 2 (two) times daily. Starting 09/20/2013 x 10 days.   Yes Historical Provider, MD  lisinopril  (PRINIVIL,ZESTRIL) 10 MG tablet Take 10 mg by mouth daily.   Yes Historical Provider, MD  LORazepam (ATIVAN) 0.5 MG tablet Take 0.5 mg by mouth 2 (two) times daily.    Yes Historical Provider, MD  metFORMIN (GLUCOPHAGE) 500 MG tablet Take 1,000 mg by mouth 2 (two) times daily with a meal.   Yes Historical Provider, MD  oxybutynin (DITROPAN XL) 15 MG 24 hr tablet Take 15 mg by mouth every evening.   Yes Historical Provider, MD  polyethylene glycol (MIRALAX / GLYCOLAX) packet Take 17 g by mouth daily.   Yes Historical Provider, MD  senna (SENOKOT) 8.6 MG TABS tablet Take 1 tablet by mouth 2 (two) times daily.   Yes Historical Provider, MD  sertraline (ZOLOFT) 25 MG tablet Take 25 mg by mouth daily.   Yes Historical Provider, MD  vitamin C (ASCORBIC ACID) 500 MG tablet Take 500 mg by mouth 2 (two) times daily.   Yes Historical Provider, MD     Allergies:  No Known Allergies  Social History:   reports that she has never smoked. She does not have any smokeless tobacco history on file. She reports that she does not drink alcohol or use illicit drugs.  Family History: History reviewed. No pertinent family history.   Physical Exam: Filed Vitals:   09/27/13 2213 09/27/13 2214 09/27/13 2215 09/28/13 0039  BP:    128/51  Pulse:    73  Temp:  102.1 F (38.9 C) 102.1 F (38.9 C) 98.1 F (36.7 C)  TempSrc:  Rectal Rectal Oral  Resp:    17  Weight:      SpO2: 99%   98%   Blood pressure 128/51, pulse 73, temperature 98.1 F (36.7 C), temperature source Oral, resp. rate 17, weight 90.719 kg (200 lb), SpO2 98.00%.  GEN:  Pleasant  patient lying in the stretcher in no acute distress; cooperative with exam. PSYCH:  alert and oriented x4; does not appear anxious or depressed; affect is appropriate. HEENT: Mucous membranes pink and anicteric; PERRLA; EOM intact; no cervical lymphadenopathy nor thyromegaly or carotid bruit; no JVD; There were no stridor. Neck is very supple. Breasts:: Not  examined CHEST WALL: No tenderness CHEST: Normal respiration, clear to auscultation bilaterally.  HEART: Regular rate and rhythm.  There are no murmur, rub, or gallops.   BACK: No kyphosis or scoliosis; no CVA tenderness ABDOMEN: soft and non-tender; no masses, no organomegaly, normal abdominal bowel sounds; no pannus; no intertriginous candida. There is no rebound and no distention. Rectal Exam: Not done EXTREMITIES: No bone or joint deformity; age-appropriate arthropathy of the hands and knees; no edema; no ulcerations.  There is no calf tenderness. Genitalia: not examined PULSES: 2+ and symmetric SKIN: Normal hydration no rash or ulceration CNS: Cranial nerves 2-12 grossly intact no focal lateralizing neurologic deficit.  Speech is fluent; uvula elevated with phonation, facial symmetry and tongue midline. DTR are normal bilaterally, cerebella exam is intact, barbinski is negative and strengths are equaled bilaterally.  No sensory loss.   Labs on Admission:  Basic Metabolic Panel:  Recent Labs Lab 09/27/13 2242  NA 136*  K 3.7  CL 97  CO2 24  GLUCOSE 298*  BUN 16  CREATININE 0.64  CALCIUM 9.1   Liver Function Tests:  Recent Labs Lab 09/27/13 2310  AST 27  ALT 28  ALKPHOS 129*  BILITOT 0.6  PROT 6.8  ALBUMIN 3.3*   No results found for this basename: LIPASE, AMYLASE,  in the last 168 hours No results found for this basename: AMMONIA,  in the last 168 hours CBC:  Recent Labs Lab 09/27/13 2242  WBC 9.9  NEUTROABS 8.7*  HGB 12.6  HCT 37.2  MCV 89.4  PLT 182   Cardiac Enzymes: No results found for this basename: CKTOTAL, CKMB, CKMBINDEX, TROPONINI,  in the last 168 hours  CBG:  Recent Labs Lab 09/27/13 2236  GLUCAP 272*     Radiological Exams on Admission: Dg Chest Portable 1 View  09/27/2013   CLINICAL DATA:  Altered mental status.  Confusion and fever.  EXAM: PORTABLE CHEST - 1 VIEW  COMPARISON:  None.  FINDINGS: The lungs are well-aerated. Minimal  right basilar opacity may reflect atelectasis or possibly mild pneumonia. Pulmonary vascularity is at the upper limits of normal. There is no evidence of pleural effusion or pneumothorax.  The cardiomediastinal silhouette is within normal limits. No acute osseous abnormalities are seen.  IMPRESSION: Minimal right basilar airspace opacity may reflect atelectasis or possibly mild pneumonia.   Electronically Signed   By: Roanna Raider M.D.   On: 09/27/2013 23:55    Assessment/Plan Present on Admission:  . UTI (lower urinary tract infection) . Diabetes mellitus . Renal stone . Healthcare-associated pneumonia . UTI (urinary tract infection)   PLAN:  Will admit her for sepsis due to UTI, and less likely is HCAP, as her CXR didn't definitely show air bronchogram and she did not have any clinical symptoms of PNA.  Her confusion, if any, was transient, because she is very lucid when I saw her.  Will give her IV Levaquin.  BC and urine culture were obtained, and will tailor her antibiotic accordingly. For her DM, will continue her metformin, and use sensitive scale for her SSI.   She is stable, full code, and will be admitted to hospitalist service.   Other plans as per orders.  Code Status: FULL Unk Lightning, MD. [facility] Pager [phone] 7pm to 7am.  09/28/2013, 1:02 AM

## 2013-09-28 NOTE — Clinical Social Work Note (Signed)
Clinical Social Work Department BRIEF PSYCHOSOCIAL ASSESSMENT 09/28/2013  Patient:  Nicole Williamson, Nicole Williamson     Account Number:  [id number]     Admit date:  09/27/2013  Clinical Social Worker:  Santa Genera, CLINICAL SOCIAL WORKER  Date/Time:  09/28/2013 11:00 AM  Referred by:  CSW  Date Referred:  09/28/2013 Referred for  SNF Placement   Other Referral:   Interview type:  Family Other interview type:   Spoke w husband, unable to reach [facility] admissions, message left w request to call CSW Stultz back.    PSYCHOSOCIAL DATA Living Status:  FACILITY Admitted from facility:  [facility] Level of care:  Skilled Nursing Facility Primary support name:  Ladonna Vanorder Primary support relationship to patient:  SPOUSE Degree of support available:   Spouse visits patient daily at SNF, supportive but unable to manage care needs at home.    CURRENT CONCERNS Current Concerns  Post-Acute Placement   Other Concerns:    SOCIAL WORK ASSESSMENT / PLAN CSW unable to assess patient directly due to patient's medical condition and lack of orientation.  Spoke w patient's husband.  Patient has been at [facility] for past 13 months, placed there following UTI.  Has had UTIs off/on for past 13 months.  At SNF, sees Dr Tomma Lightning for treatment of kidney issues.  Patient has been weak and has made little progress w regaining strength in rehab.  At present only ambulates w wheelchair and can only stand for 2 minutes at a time.  Requires staff assistance for transfers, toileting and bathing.  Husband cannot care for patient at home, wants her to return to [facility] at discharge.  Husband states he lives 14 miles away and has only missed 2 days visitation over past 13 months.  Has been to visit patient at [facility] this morning.    CSW unable to reach [facility] admissions, left message for Eunice Blase to return call to CSW [place] at [facility].   Assessment/plan status:  Psychosocial Support/Ongoing Assessment of Needs Other assessment/ plan:    Information/referral to community resources:   Return to [facility] at [employer]    PATIENT'S/FAMILY'S RESPONSE TO PLAN OF CARE: Husband appears supportive of wife although cannot care for at home, has tried to visit daily and provides psychosocial support for wife.      Santa Genera, LCSW Clinical Social Worker [phone])

## 2013-09-28 NOTE — Care Management Note (Signed)
    Page 1 of 1   09/28/2013     5:34:50 PM CARE MANAGEMENT NOTE 09/28/2013  Patient:  Nicole Williamson, Nicole Williamson   Account Number:  [id number]  Date Initiated:  09/28/2013  Documentation initiated by:  Anibal Henderson  Subjective/Objective Assessment:   Admitted with PNA, from [facility]. Will return to [facility] at D/C     Action/Plan:   CSW aware of admission   Anticipated DC Date:  09/30/2013   Anticipated DC Plan:  SKILLED NURSING FACILITY  In-house referral  Clinical Social Worker      DC Planning Services  CM consult      Choice offered to / List presented to:             Status of service:  Completed, signed off Medicare Important Message given?  YES (If response is "NO", the following Medicare IM given date fields will be blank) Date Medicare IM given:  09/28/2013 Medicare IM given by:  Anibal Henderson Date Additional Medicare IM given:   Additional Medicare IM given by:    Discharge Disposition:  SKILLED NURSING FACILITY  Per UR Regulation:  Reviewed for med. necessity/level of care/duration of stay  If discussed at Long Length of Stay Meetings, dates discussed:    Comments:  09/28/13/1500 Anibal Henderson RN/CM

## 2013-09-28 NOTE — Clinical Social Work Note (Signed)
Per Eunice Blase at [facility], pt is nursing level of care and okay to return. Eunice Blase will attempt [facility] authorization, but will accept pt back regardless. CSW sent clinicals to [facility] to begin authorization. Awaiting PT evaluation.   Derenda Fennel, [place] [phone]

## 2013-09-28 NOTE — Care Management Utilization Note (Signed)
UR completed 

## 2013-09-29 DIAGNOSIS — E119 Type 2 diabetes mellitus without complications: Secondary | ICD-10-CM

## 2013-09-29 DIAGNOSIS — I10 Essential (primary) hypertension: Secondary | ICD-10-CM

## 2013-09-29 DIAGNOSIS — F419 Anxiety disorder, unspecified: Secondary | ICD-10-CM | POA: Diagnosis present

## 2013-09-29 LAB — CBC
HCT: 34.5 % — ABNORMAL LOW (ref 36.0–46.0)
Hemoglobin: 11.7 g/dL — ABNORMAL LOW (ref 12.0–15.0)
MCH: 30.2 pg (ref 26.0–34.0)
MCHC: 33.9 g/dL (ref 30.0–36.0)
MCV: 89.1 fL (ref 78.0–100.0)
PLATELETS: 165 10*3/uL (ref 150–400)
RBC: 3.87 MIL/uL (ref 3.87–5.11)
RDW: 13 % (ref 11.5–15.5)
WBC: 6.6 10*3/uL (ref 4.0–10.5)

## 2013-09-29 LAB — GLUCOSE, CAPILLARY
GLUCOSE-CAPILLARY: 222 mg/dL — AB (ref 70–99)
GLUCOSE-CAPILLARY: 157 mg/dL — AB (ref 70–99)
GLUCOSE-CAPILLARY: 169 mg/dL — AB (ref 70–99)
Glucose-Capillary: 123 mg/dL — ABNORMAL HIGH (ref 70–99)

## 2013-09-29 LAB — BASIC METABOLIC PANEL
ANION GAP: 12 (ref 5–15)
BUN: 14 mg/dL (ref 6–23)
CALCIUM: 8.6 mg/dL (ref 8.4–10.5)
CO2: 24 mEq/L (ref 19–32)
Chloride: 103 mEq/L (ref 96–112)
Creatinine, Ser: 0.51 mg/dL (ref 0.50–1.10)
GFR calc non Af Amer: 88 mL/min — ABNORMAL LOW (ref >90)
Glucose, Bld: 149 mg/dL — ABNORMAL HIGH (ref 70–99)
POTASSIUM: 3.8 meq/L (ref 3.7–5.3)
Sodium: 139 mEq/L (ref 137–147)

## 2013-09-29 LAB — HEMOGLOBIN A1C
Hgb A1c MFr Bld: 8.6 % — ABNORMAL HIGH (ref <5.7)
MEAN PLASMA GLUCOSE: 200 mg/dL — AB (ref <117)

## 2013-09-29 LAB — TSH: TSH: 2.55 u[IU]/mL (ref 0.350–4.500)

## 2013-09-29 MED ORDER — LORAZEPAM 0.5 MG PO TABS
0.5000 mg | ORAL_TABLET | Freq: Two times a day (BID) | ORAL | Status: DC
  Administered 2013-09-29 – 2013-10-01 (×4): 0.5 mg via ORAL
  Filled 2013-09-29 (×4): qty 1

## 2013-09-29 MED ORDER — OXYMETAZOLINE HCL 0.05 % NA SOLN
1.0000 | Freq: Every day | NASAL | Status: DC
  Administered 2013-09-29 – 2013-09-30 (×2): 1 via NASAL
  Filled 2013-09-29 (×2): qty 15

## 2013-09-29 MED ORDER — INSULIN DETEMIR 100 UNIT/ML ~~LOC~~ SOLN
5.0000 [IU] | Freq: Two times a day (BID) | SUBCUTANEOUS | Status: DC
  Administered 2013-09-29 – 2013-10-01 (×4): 5 [IU] via SUBCUTANEOUS
  Filled 2013-09-29 (×6): qty 0.05

## 2013-09-29 MED ORDER — LORAZEPAM 0.5 MG PO TABS: 0.5000 mg | ORAL_TABLET | Freq: Every day | ORAL | Status: DC

## 2013-09-29 NOTE — Progress Notes (Addendum)
[facility] PROGRESS NOTE  Nicole Williamson XBJ:[id number] DOB: 25-Apr-1931 DOA: 09/27/2013 PCP: No PCP Per Patient     Code Status: Full code Family Communication: Discussed with her husband Disposition Plan: Discharge back to skilled nursing facility in the next 24-48 hours.   Consultants:  none  Procedures:  None  Antibiotics:  Levaquin 09/28/17>>  Vancomycin 09/28/17>>  HPI/Subjective: The patient is sitting up in bed eating. Her husband is in the room. Questions answered. She was sent a dysphagia 2 diet, but states that she usually has her meat grind it up. She is tearful about being in the hospital. She has some nasal congestion.  Objective: Filed Vitals:   09/29/13 1438  BP: 145/50  Pulse: 67  Temp: 97.5 F (36.4 C)  Resp: 18   oxygen saturation 100% on room air.  Intake/Output Summary (Last 24 hours) at 09/29/13 1546 Last data filed at 09/29/13 1408  Gross per 24 hour  Intake    480 ml  Output      0 ml  Net    480 ml   Filed Weights   09/27/13 2209 09/28/13 0313  Weight: 90.719 kg (200 lb) 90.13 kg (198 lb 11.2 oz)    Exam:   General:  Obese 80 year old woman sitting up in bed, in no acute distress.  Cardiovascular: S1, S2, with a soft systolic murmur.  Respiratory: Clear anteriorly with decreased breath sounds in the bases. Breathing is nonlabored.  Abdomen: Obese, positive bowel sounds, soft, nontender, nondistended.  Musculoskeletal: No acute hot red joints.   Data Reviewed: Basic Metabolic Panel:  Recent Labs Lab 09/27/13 2242 09/28/13 0609 09/29/13 0511  NA 136* 138 139  K 3.7 3.5* 3.8  CL 97 102 103  CO2 GLUCOSE 298* 244* 149*  BUN CREATININE 0.64 0.48* 0.51  CALCIUM 9.1 8.6 8.6   Liver Function Tests:  Recent Labs Lab 09/27/13 2310 09/28/13 0609  AST 27 25  ALT 28 24  ALKPHOS 129* 116  BILITOT 0.6 0.5  PROT 6.8 6.1  ALBUMIN 3.3* 2.8*   No results found for this basename: LIPASE, AMYLASE,   in the last 168 hours No results found for this basename: AMMONIA,  in the last 168 hours CBC:  Recent Labs Lab 09/27/13 2242 09/28/13 0609 09/29/13 0511  WBC 9.9 7.5 6.6  NEUTROABS 8.7*  --   --   HGB 12.6 11.7* 11.7*  HCT 37.2 34.5* 34.5*  MCV 89.4 89.1 89.1  PLT 182 180 165   Cardiac Enzymes: No results found for this basename: CKTOTAL, CKMB, CKMBINDEX, TROPONINI,  in the last 168 hours BNP (last 3 results) No results found for this basename: PROBNP,  in the last 8760 hours CBG:  Recent Labs Lab 09/28/13 1127 09/28/13 1647 09/28/13 2203 09/29/13 0732 09/29/13 1144  GLUCAP 248* 234* 168* 123* 222*    Recent Results (from the past 240 hour(s))  CULTURE, BLOOD (ROUTINE X 2)     Status: None   Collection Time    09/27/13 10:42 PM      Result Value Ref Range Status   Specimen Description BLOOD LEFT ARM   Final   Special Requests     Final   Value: BOTTLES DRAWN AEROBIC AND ANAEROBIC AEB 12CC ANA 10CC   Culture NO GROWTH 2 DAYS   Final   Report Status PENDING   Incomplete  CULTURE, BLOOD (ROUTINE X 2)     Status: None   Collection Time  09/27/13 10:53 PM      Result Value Ref Range Status   Specimen Description BLOOD LEFT ARM   Final   Special Requests BOTTLES DRAWN AEROBIC ONLY 6CC   Final   Culture NO GROWTH 2 DAYS   Final   Report Status PENDING   Incomplete  MRSA PCR SCREENING     Status: None   Collection Time    09/28/13 11:45 AM      Result Value Ref Range Status   MRSA by PCR NEGATIVE  NEGATIVE Final   Comment:            The GeneXpert MRSA Assay (FDA     approved for NASAL specimens     only), is one component of a     comprehensive MRSA colonization     surveillance program. It is not     intended to diagnose MRSA     infection nor to guide or     monitor treatment for     MRSA infections.     Studies: Dg Chest Portable 1 View  09/27/2013   CLINICAL DATA:  Altered mental status.  Confusion and fever.  EXAM: PORTABLE CHEST - 1 VIEW   COMPARISON:  None.  FINDINGS: The lungs are well-aerated. Minimal right basilar opacity may reflect atelectasis or possibly mild pneumonia. Pulmonary vascularity is at the upper limits of normal. There is no evidence of pleural effusion or pneumothorax.  The cardiomediastinal silhouette is within normal limits. No acute osseous abnormalities are seen.  IMPRESSION: Minimal right basilar airspace opacity may reflect atelectasis or possibly mild pneumonia.   Electronically Signed   By: Roanna Raider M.D.   On: 09/27/2013 23:55    Scheduled Meds: . acetaminophen  325 mg Oral QID  . aspirin  325 mg Oral Daily  . ferrous fumarate  1 tablet Oral BID WC  . heparin  5,000 Units Subcutaneous 3 times per day  . insulin aspart  0-5 Units Subcutaneous QHS  . insulin aspart  0-9 Units Subcutaneous TID WC  . insulin detemir  7 Units Subcutaneous BID  . levofloxacin (LEVAQUIN) IV  750 mg Intravenous QHS  . lisinopril  10 mg Oral Daily  . metFORMIN  1,000 mg Oral BID WC  . oxybutynin  15 mg Oral QPM  . oxymetazoline  1 spray Each Nare Q1400  . polyethylene glycol  17 g Oral Daily  . potassium chloride  20 mEq Oral Daily  . senna  1 tablet Oral BID  . vancomycin  1,000 mg Intravenous Q12H  . vitamin C  500 mg Oral BID   Continuous Infusions:   Assessment and plan: Principal Problem:   UTI (lower urinary tract infection) Active Problems:   Healthcare-associated pneumonia   Diabetes mellitus type 2, uncomplicated   UTI (urinary tract infection)   Obesity, morbid   1. Urinary tract infection. She is improving clinically. She is no longer febrile. We will continue Levaquin. Blood cultures negative to date. Urine culture has not been resulted yet.  Possible healthcare associated pneumonia. She has no complaints of cough or congestion. Her oxygen saturations are generally in the upper 90s to 100% on room air. We'll continue vancomycin and Levaquin. Continue supportive treatment.  Type 2 diabetes  mellitus. Her capillary blood glucose has been in the 200s consistently. We'll continue metformin. Levemir and sliding-scale NovoLog added earlier. Continue to monitor and adjust medications as needed.  Mild dilutional anemia. Her hemoglobin was 12.6 on admission and has decreased to  11.7. No further workup warranted at this time. We'll continue to monitor.  Chronically bedridden/wheelchair-bound. She will be discharged back to [facility] skilled nursing facility when medically appropriate.  Hypertension. Her blood pressure is trending up. Will continue lisinopril at its current dose.  Anxiousness/chronic anxiety. Will restart lorazepam twice daily.  Afrin ordered daily for nasal congestion. The patient was reassured that she is getting better and the plan is to discharge her back to [facility] skilled nursing facility in the next 24-48 hours.    Time spent: 25 minutes    [facility]  [facility] Pager [phone]. If 7PM-7AM, please contact night-coverage at [email or web address], password [facility] 09/29/2013, 3:46 PM  LOS: 2 days

## 2013-09-30 ENCOUNTER — Encounter (HOSPITAL_COMMUNITY): Payer: Self-pay | Admitting: Internal Medicine

## 2013-09-30 DIAGNOSIS — B9689 Other specified bacterial agents as the cause of diseases classified elsewhere: Secondary | ICD-10-CM

## 2013-09-30 DIAGNOSIS — R079 Chest pain, unspecified: Secondary | ICD-10-CM | POA: Diagnosis not present

## 2013-09-30 DIAGNOSIS — R7881 Bacteremia: Secondary | ICD-10-CM

## 2013-09-30 HISTORY — DX: Bacteremia: R78.81

## 2013-09-30 LAB — URINE CULTURE: Colony Count: >=100000

## 2013-09-30 LAB — GLUCOSE, CAPILLARY
GLUCOSE-CAPILLARY: 116 mg/dL — AB (ref 70–99)
Glucose-Capillary: 216 mg/dL — ABNORMAL HIGH (ref 70–99)
Glucose-Capillary: 110 mg/dL — ABNORMAL HIGH (ref 70–99)
Glucose-Capillary: 137 mg/dL — ABNORMAL HIGH (ref 70–99)

## 2013-09-30 MED ORDER — LEVOFLOXACIN 750 MG PO TABS
750.0000 mg | ORAL_TABLET | Freq: Every day | ORAL | Status: DC
  Administered 2013-09-30: 750 mg via ORAL
  Filled 2013-09-30: qty 1

## 2013-09-30 NOTE — Progress Notes (Signed)
PHARMACIST - PHYSICIAN COMMUNICATION DR:   Sherrie Mustache CONCERNING: Antibiotic IV to Oral Route Change Policy  RECOMMENDATION: This patient is receiving Levaquin by the intravenous route.  Based on criteria approved by the Pharmacy and Therapeutics Committee, the antibiotic(s) is/are being converted to the equivalent oral dose form(s).   DESCRIPTION: These criteria include:  Patient being treated for a respiratory tract infection, urinary tract infection, cellulitis or clostridium difficile associated diarrhea if on metronidazole  The patient is not neutropenic and does not exhibit a GI malabsorption state  The patient is eating (either orally or via tube) and/or has been taking other orally administered medications for a least 24 hours  The patient is improving clinically and has a Tmax < 100.5  If you have questions about this conversion, please contact the Pharmacy Department    [phone] )  [facility]   [phone] )  [facility]    [phone] )  [facility]   [phone] )  [facility]    Junita Push, PharmD, BCPS 09/30/2013@10 :02 AM

## 2013-09-30 NOTE — Plan of Care (Signed)
Critical Lab: Positive blood culture drawn on 9/17 showed gram negative rods.  Dr. Informed.

## 2013-09-30 NOTE — Plan of Care (Signed)
Both Tech and myself tried to get pt to sit in chair for a while - but pt refused.  She insisted that she was too weak and sick to try to stand.

## 2013-09-30 NOTE — Progress Notes (Signed)
[facility] PROGRESS NOTE  Nicole Williamson ZOX:[id number] DOB: 1931/02/01 DOA: 09/27/2013 PCP: No PCP Per Patient     Code Status: Full code Family Communication: Discussed with her husband Disposition Plan: Discharge back to skilled nursing facility in the next 24-48 hours.   Consultants:  none  Procedures:  None  Antibiotics:  Levaquin 09/28/17>>  Vancomycin 09/28/17>>  HPI/Subjective: The patient is sitting up in bed eating. She is hesitant to say she feels better because she does not want to be discharged yet. No specific complaints.  Objective: Filed Vitals:   09/30/13 1430  BP: 143/50  Pulse: 75  Temp: 97.5 F (36.4 C)  Resp: 20   oxygen saturation 98% on room air.  Intake/Output Summary (Last 24 hours) at 09/30/13 1603 Last data filed at 09/30/13 1428  Gross per 24 hour  Intake    490 ml  Output    200 ml  Net    290 ml   Filed Weights   09/27/13 2209 09/28/13 0313  Weight: 90.719 kg (200 lb) 90.13 kg (198 lb 11.2 oz)    Exam:   General:  Obese 80 year old woman sitting up in bed, in no acute distress.  Cardiovascular: S1, S2, with a soft systolic murmur.  Respiratory: Clear anteriorly with decreased breath sounds in the bases. Breathing is nonlabored.  Abdomen: Obese, positive bowel sounds, soft, nontender, nondistended.  Musculoskeletal: No acute hot red joints.   Neuropsychiatric: She is slightly anxious when discussing disposition about possible discharge. Otherwise, she is alert and oriented to her surroundings. Cranial nerves II through XII are grossly intact.  Data Reviewed: Basic Metabolic Panel:  Recent Labs Lab 09/27/13 2242 09/28/13 0609 09/29/13 0511  NA 136* 138 139  K 3.7 3.5* 3.8  CL 97 102 103  CO2 GLUCOSE 298* 244* 149*  BUN CREATININE 0.64 0.48* 0.51  CALCIUM 9.1 8.6 8.6   Liver Function Tests:  Recent Labs Lab 09/27/13 2310 09/28/13 0609  AST 27 25  ALT 28 24  ALKPHOS 129* 116   BILITOT 0.6 0.5  PROT 6.8 6.1  ALBUMIN 3.3* 2.8*   No results found for this basename: LIPASE, AMYLASE,  in the last 168 hours No results found for this basename: AMMONIA,  in the last 168 hours CBC:  Recent Labs Lab 09/27/13 2242 09/28/13 0609 09/29/13 0511  WBC 9.9 7.5 6.6  NEUTROABS 8.7*  --   --   HGB 12.6 11.7* 11.7*  HCT 37.2 34.5* 34.5*  MCV 89.4 89.1 89.1  PLT 182 180 165   Cardiac Enzymes: No results found for this basename: CKTOTAL, CKMB, CKMBINDEX, TROPONINI,  in the last 168 hours BNP (last 3 results) No results found for this basename: PROBNP,  in the last 8760 hours CBG:  Recent Labs Lab 09/29/13 1144 09/29/13 1613 09/29/13 2009 09/30/13 0750 09/30/13 1130  GLUCAP 222* 157* 169* 116* 216*    Recent Results (from the past 240 hour(s))  CULTURE, BLOOD (ROUTINE X 2)     Status: None   Collection Time    09/27/13 10:42 PM      Result Value Ref Range Status   Specimen Description BLOOD LEFT ARM   Final   Special Requests     Final   Value: BOTTLES DRAWN AEROBIC AND ANAEROBIC AEB 12CC ANA 10CC   Culture     Final   Value: GRAM NEGATIVE RODS     Gram Stain Report Called to,Read Back By and Verified  With: WRIGHT M. AT 1109A ON [id number] BY THOMPSON S.   Report Status PENDING   Incomplete  CULTURE, BLOOD (ROUTINE X 2)     Status: None   Collection Time    09/27/13 10:53 PM      Result Value Ref Range Status   Specimen Description BLOOD LEFT ARM   Final   Special Requests BOTTLES DRAWN AEROBIC ONLY 6CC   Final   Culture NO GROWTH 3 DAYS   Final   Report Status PENDING   Incomplete  URINE CULTURE     Status: None   Collection Time    09/27/13 11:00 PM      Result Value Ref Range Status   Specimen Description URINE, CATHETERIZED   Final   Special Requests NONE   Final   Culture  Setup Time     Final   Value: 09/28/2013 14:45     Performed at [facility] Count     Final   Value: >=100,000 COLONIES/ML     Performed at [facility]   Culture     Final   Value: PROTEUS MIRABILIS     Performed at [facility]   Report Status 09/30/2013 FINAL   Final   Organism ID, Bacteria PROTEUS MIRABILIS   Final  MRSA PCR SCREENING     Status: None   Collection Time    09/28/13 11:45 AM      Result Value Ref Range Status   MRSA by PCR NEGATIVE  NEGATIVE Final   Comment:            The GeneXpert MRSA Assay (FDA     approved for NASAL specimens     only), is one component of a     comprehensive MRSA colonization     surveillance program. It is not     intended to diagnose MRSA     infection nor to guide or     monitor treatment for     MRSA infections.     Studies: No results found.  Scheduled Meds: . acetaminophen  325 mg Oral QID  . aspirin  325 mg Oral Daily  . ferrous fumarate  1 tablet Oral BID WC  . heparin  5,000 Units Subcutaneous 3 times per day  . insulin aspart  0-5 Units Subcutaneous QHS  . insulin aspart  0-9 Units Subcutaneous TID WC  . insulin detemir  5 Units Subcutaneous BID  . levofloxacin  750 mg Oral QHS  . lisinopril  10 mg Oral Daily  . LORazepam  0.5 mg Oral BID  . metFORMIN  1,000 mg Oral BID WC  . oxybutynin  15 mg Oral QPM  . oxymetazoline  1 spray Each Nare Q1400  . polyethylene glycol  17 g Oral Daily  . senna  1 tablet Oral BID  . vancomycin  1,000 mg Intravenous Q12H  . vitamin C  500 mg Oral BID   Continuous Infusions:   Assessment and plan: Principal Problem:   UTI (lower urinary tract infection) Active Problems:   Healthcare-associated pneumonia   Gram-negative bacteremia   Diabetes mellitus type 2, uncomplicated   Obesity, morbid   Unspecified essential hypertension   Chronic anxiety   1. Urinary tract infection secondary to Proteus mirabilis and associated gram-negative bacteremia.  She is improving clinically. She is no longer febrile. Her urine culture is growing greater than 100,000 colonies of Proteus mirabilis One out of 2 blood cultures became  positive  today for gram-negative rods. This is likely from the Proteus. We will continue Levaquin. We'll consider discharge tomorrow once the sensitivities have been resolved.  Possible healthcare associated pneumonia.  She has no complaints of cough or congestion. Her oxygen saturations are generally in the upper 90s to 100% on room air. We'll continue vancomycin and Levaquin. Continue supportive treatment.  Type 2 diabetes mellitus.  Her capillary blood glucose has been in the 200s consistently. We'll continue metformin. Levemir and sliding-scale NovoLog added earlier. Continue to monitor and adjust medications as needed. Her hemoglobin A1c was 8.6 indicating suboptimal control. At the time of discharge, she will likely need some type of insulin coverage in addition to metformin.  Mild dilutional anemia.  Her hemoglobin was 12.6 on admission and has decreased to 11.7. No further workup warranted at this time. We'll continue to monitor.  Chronically bedridden/wheelchair-bound. She will be discharged back to [facility] skilled nursing facility when medically appropriate.  Hypertension.  Her blood pressures have waxed and waned, but generally less than 150 systolically. Will continue lisinopril at its current dose.  Anxiousness/chronic anxiety.  Home dose of lorazepam was restarted.  Afrin ordered daily for nasal congestion. The patient was reassured that she is getting better and the plan is to discharge her back to [facility] skilled nursing facility in the next 24-48 hours.    Time spent: 25 minutes    [facility]  [facility] Pager [phone]. If 7PM-7AM, please contact night-coverage at [email or web address], password [facility] 09/30/2013, 4:03 PM  LOS: 3 days

## 2013-10-01 DIAGNOSIS — F411 Generalized anxiety disorder: Secondary | ICD-10-CM

## 2013-10-01 LAB — CBC
HEMATOCRIT: 36.2 % (ref 36.0–46.0)
Hemoglobin: 12.4 g/dL (ref 12.0–15.0)
MCH: 30.2 pg (ref 26.0–34.0)
MCHC: 34.3 g/dL (ref 30.0–36.0)
MCV: 88.3 fL (ref 78.0–100.0)
PLATELETS: 181 10*3/uL (ref 150–400)
RBC: 4.1 MIL/uL (ref 3.87–5.11)
RDW: 12.9 % (ref 11.5–15.5)
WBC: 5.6 10*3/uL (ref 4.0–10.5)

## 2013-10-01 LAB — BASIC METABOLIC PANEL
Anion gap: 13 (ref 5–15)
BUN: 11 mg/dL (ref 6–23)
CALCIUM: 8.8 mg/dL (ref 8.4–10.5)
CO2: 25 meq/L (ref 19–32)
Chloride: 104 mEq/L (ref 96–112)
Creatinine, Ser: 0.49 mg/dL — ABNORMAL LOW (ref 0.50–1.10)
GFR calc Af Amer: >90 mL/min (ref >90)
GFR calc non Af Amer: 90 mL/min — ABNORMAL LOW (ref >90)
GLUCOSE: 99 mg/dL (ref 70–99)
Potassium: 3.8 mEq/L (ref 3.7–5.3)
Sodium: 142 mEq/L (ref 137–147)

## 2013-10-01 LAB — CLOSTRIDIUM DIFFICILE BY PCR: Toxigenic C. Difficile by PCR: NEGATIVE

## 2013-10-01 LAB — GLUCOSE, CAPILLARY
GLUCOSE-CAPILLARY: 127 mg/dL — AB (ref 70–99)
Glucose-Capillary: 150 mg/dL — ABNORMAL HIGH (ref 70–99)

## 2013-10-01 MED ORDER — POLYETHYLENE GLYCOL 3350 17 G PO PACK: 17.0000 g | PACK | Freq: Every day | ORAL | Status: AC | PRN

## 2013-10-01 MED ORDER — DEXTROSE 5 % IV SOLN: 1.0000 g | INTRAVENOUS | Status: AC

## 2013-10-01 MED ORDER — SENNA 8.6 MG PO TABS: 1.0000 | ORAL_TABLET | Freq: Every day | ORAL | Status: AC | PRN

## 2013-10-01 MED ORDER — CEFTRIAXONE SODIUM 1 G IJ SOLR
1.0000 g | INTRAMUSCULAR | Status: DC
  Administered 2013-10-01: 1 g via INTRAVENOUS
  Filled 2013-10-01 (×2): qty 10

## 2013-10-01 MED ORDER — INSULIN DETEMIR 100 UNIT/ML ~~LOC~~ SOLN: 15.0000 [IU] | Freq: Every day | SUBCUTANEOUS | Status: AC

## 2013-10-01 MED ORDER — OXYMETAZOLINE HCL 0.05 % NA SOLN: 1.0000 | Freq: Two times a day (BID) | NASAL | Status: AC | PRN

## 2013-10-01 NOTE — Progress Notes (Signed)
The patient's urine culture grew out Proteus mirabilis, sensitive to Rocephin. One out of 2 blood cultures were positive for gram-negative rod which is likely Proteus. For this reason, the patient will need to be treated with IV Rocephin in the outpatient setting for 6 more days.

## 2013-10-01 NOTE — Progress Notes (Signed)
Late entry:  Patient has had three loose stools today.  Specimen for CDiff obtained per protocol and patient placed on enteric precautions.  Dr. Sherrie Mustache on unit and notified.

## 2013-10-01 NOTE — Clinical Social Work Note (Signed)
Pt d/c today back to [facility]. Pt, pt's daughter-in-law, and facility aware and agreeable. Left voicemail for pt's husband. D/C summary faxed. Pt to transfer via [facility].  Derenda Fennel, [place] [phone]

## 2013-10-01 NOTE — Discharge Summary (Signed)
Physician Discharge Summary  Nicole Williamson ZOX:[id number] DOB: [birth year] DOA: 09/27/2013  PCP: No PCP Per Patient  Admit date: 09/27/2013 Discharge date: 10/01/2013  Time spent: GREATER THAN 30 minutes  Recommendations for Outpatient Follow-up:  1.  All Identification of gram-negative rods and sensitivities were pending at the time of discharge. These will need to be followed up upon. 2. The patient is being discharged to the [facility] for further rehabilitation. She is being discharged on IV Rocephin for 6 more days.  Discharge Diagnoses:  1. Urinary tract infection, secondary to Proteus mirabilis. 2. Gram-negative rod bacteremia presumed to be secondary to Proteus (one out of 2 blood cultures positive). 3. Possible healthcare acquired pneumonia. 4. Loose stools, likely secondary to laxative therapy. C. difficile PCR negative. 5. Hypertension. 6. Type 2 diabetes mellitus. Hemoglobin A1c was 8.6. 7. Morbid obesity. 8. Chronic anxiousness/chronic anxiety.   Discharge Condition: Improved.  Diet recommendation: Heart healthy/carbohydrate modified.  Filed Weights   09/27/13 2209 09/28/13 0313 10/01/13 0433  Weight: 90.719 kg (200 lb) 90.13 kg (198 lb 11.2 oz) 90.039 kg (198 lb 8 oz)    History of present illness:  The patient is an 80 year old woman with a history of diabetes mellitus, hypertension, chronic cystitis, and nephrolithiasis, who presented from [facility] skilled nursing facility with fever and reported transient confusion. In the ED, she was febrile with temperature of 102.1. Otherwise, she was hemodynamically stable. Her lab data were significant for a venous glucose of 298 and a urinalysis that revealed many bacteria, 11-20 WBCs, and greater than 1000 glucose. Her chest x-ray revealed right base opacity infiltrate versus atelectasis. She was admitted for further evaluation and management.  Hospital Course:   1. Urinary tract infection secondary to Proteus mirabilis and  associated gram-negative bacteremia.  Blood cultures and a urine culture were ordered. She was started on Levaquin empirically. Vancomycin was added shortly thereafter for treatment of possible healthcare acquired pneumonia. Symptomatic treatment was given with as needed analgesics and as the antiemetics. Gentle IV fluids were given for the first 48 hours. She improved clinically and symptomatically. She became afebrile. Her white blood cell count remained within normal limits. Her urine grew out greater than 100,000 colonies sensitive to Rocephin, but resistant to Levaquin. One out of 2 blood cultures became positive for gram-negative rods, thought to be secondary to Proteus-identification still pending. Nevertheless, the patient improved clinically on vancomycin and Levaquin. However given the sensitivities, it was decided that she should be started on Rocephin IV daily for a total of 7 days. She received 1 dose today on 10/01/13. She was discharged on 6 more days of Rocephin.  Possible healthcare associated pneumonia.  The patient really had no complaints of cough or congestion. Her oxygen saturations generally ranged in the upper 90s on room air. She was treated as above in #1.  Type 2 diabetes mellitus.  Her capillary blood glucose were in the 190s- 200s consistently. She was continued on metformin. Sliding scale NovoLog and Levemir were added. Her hemoglobin A1c was 8.6 indicating suboptimal control. She was discharged on previous sliding scale NovoLog and metformin. Levemir was added at 15 units each bedtime at the time of discharge. Further management per skilled nursing facility physician.  Mild dilutional anemia.  Her hemoglobin was 12.6 on admission and has decreased to 11.7. No further workup warranted at this time. We'll continue to monitor.  Chronically deconditioned.  She will be discharged back to [facility] skilled nursing facility for further rehabilitation.  Hypertension.  Her blood  pressures have waxed and waned, but was generally less than 150 systolically. Will continue lisinopril at its current dose.  Anxiousness/chronic anxiety.  Home dose of lorazepam was restarted.   Afrin ordered daily for nasal congestion.      Procedures:  None  Consultations:  None  Discharge Exam: Filed Vitals:   10/01/13 0433  BP: 155/60  Pulse: 70  Temp: 98.2 F (36.8 C)  Resp: 20    General: Obese 80 year old woman sitting up in bed, in no acute distress. She is anxious about going back to the skilled nursing facility. Cardiovascular: S1, S2, with no murmurs rubs or gallops. Respiratory: Breathing is nonlabored. Decreased breath sounds in the bases. Otherwise clear anteriorly.  Discharge Instructions You were cared for by a hospitalist during your hospital stay. If you have any questions about your discharge medications or the care you received while you were in the hospital after you are discharged, you can call the unit and asked to speak with the hospitalist on call if the hospitalist that took care of you is not available. Once you are discharged, your primary care physician will handle any further medical issues. Please note that NO REFILLS for any discharge medications will be authorized once you are discharged, as it is imperative that you return to your primary care physician (or establish a relationship with a primary care physician if you do not have one) for your aftercare needs so that they can reassess your need for medications and monitor your lab values.  Discharge Instructions   Diet - low sodium heart healthy    Complete by:  As directed      Diet Carb Modified    Complete by:  As directed      Increase activity slowly    Complete by:  As directed           Current Discharge Medication List    START taking these medications   Details  cefTRIAXone 1 g in dextrose 5 % 50 mL Inject 1 g into the vein daily. Give for 6 days, starting tomorrow. Qty: 6 g,  Refills: 0    insulin detemir (LEVEMIR) 100 UNIT/ML injection Inject 0.15 mLs (15 Units total) into the skin at bedtime.    oxymetazoline (AFRIN) 0.05 % nasal spray Place 1 spray into both nostrils 2 (two) times daily as needed for congestion.      CONTINUE these medications which have CHANGED   Details  polyethylene glycol (MIRALAX / GLYCOLAX) packet Take 17 g by mouth daily as needed.    senna (SENOKOT) 8.6 MG TABS tablet Take 1 tablet (8.6 mg total) by mouth daily as needed for mild constipation.      CONTINUE these medications which have NOT CHANGED   Details  acetaminophen (TYLENOL) 650 MG CR tablet Take 650 mg by mouth 2 (two) times daily.    aspirin 325 MG tablet Take 1 tablet (325 mg total) by mouth daily.    CRANBERRY PO Take by mouth.    ferrous fumarate (HEMOCYTE - 106 MG FE) 325 (106 FE) MG TABS tablet Take 1 tablet by mouth 2 (two) times daily.    HYDROcodone-acetaminophen (NORCO/VICODIN) 5-325 MG per tablet Take 1 tablet by mouth every 4 (four) hours as needed for moderate pain.    insulin aspart (NOVOLOG) 100 UNIT/ML injection Inject 2-8 Units into the skin 3 (three) times daily before meals. SSI with meals and at bedtime.    linezolid (ZYVOX) 600 MG tablet Take 600 mg by  mouth 2 (two) times daily. Starting 09/20/2013 x 10 days.    lisinopril (PRINIVIL,ZESTRIL) 10 MG tablet Take 10 mg by mouth daily.    LORazepam (ATIVAN) 0.5 MG tablet Take 0.5 mg by mouth 2 (two) times daily.     metFORMIN (GLUCOPHAGE) 500 MG tablet Take 1,000 mg by mouth 2 (two) times daily with a meal.    oxybutynin (DITROPAN XL) 15 MG 24 hr tablet Take 15 mg by mouth every evening.    sertraline (ZOLOFT) 25 MG tablet Take 25 mg by mouth daily.    vitamin C (ASCORBIC ACID) 500 MG tablet Take 500 mg by mouth 2 (two) times daily.       No Known Allergies    The results of significant diagnostics from this hospitalization (including imaging, microbiology, ancillary and laboratory) are  listed below for reference.    Significant Diagnostic Studies: Dg Chest Portable 1 View  09/27/2013   CLINICAL DATA:  Altered mental status.  Confusion and fever.  EXAM: PORTABLE CHEST - 1 VIEW  COMPARISON:  None.  FINDINGS: The lungs are well-aerated. Minimal right basilar opacity may reflect atelectasis or possibly mild pneumonia. Pulmonary vascularity is at the upper limits of normal. There is no evidence of pleural effusion or pneumothorax.  The cardiomediastinal silhouette is within normal limits. No acute osseous abnormalities are seen.  IMPRESSION: Minimal right basilar airspace opacity may reflect atelectasis or possibly mild pneumonia.   Electronically Signed   By: Roanna Raider M.D.   On: 09/27/2013 23:55    Microbiology: Recent Results (from the past 240 hour(s))  CULTURE, BLOOD (ROUTINE X 2)     Status: None   Collection Time    09/27/13 10:42 PM      Result Value Ref Range Status   Specimen Description BLOOD LEFT ARM   Final   Special Requests     Final   Value: BOTTLES DRAWN AEROBIC AND ANAEROBIC AEB 12CC ANA 10CC   Culture  Setup Time     Final   Value: 09/30/2013 22:06     Performed at [facility]   Culture     Final   Value: PROTEUS MIRABILIS     Note: Gram Stain Report Called to,Read Back By and Verified With: WRIGHT M AT 1109A [id number] BY THOMPSON S      Performed at [facility]   Report Status PENDING   Incomplete  CULTURE, BLOOD (ROUTINE X 2)     Status: None   Collection Time    09/27/13 10:53 PM      Result Value Ref Range Status   Specimen Description BLOOD LEFT ARM   Final   Special Requests BOTTLES DRAWN AEROBIC ONLY 6CC   Final   Culture NO GROWTH 4 DAYS   Final   Report Status PENDING   Incomplete  URINE CULTURE     Status: None   Collection Time    09/27/13 11:00 PM      Result Value Ref Range Status   Specimen Description URINE, CATHETERIZED   Final   Special Requests NONE   Final   Culture  Setup Time     Final   Value: 09/28/2013  14:45     Performed at [facility] Count     Final   Value: >=100,000 COLONIES/ML     Performed at [facility]   Culture     Final   Value: PROTEUS MIRABILIS     Performed at [facility]   Report Status 09/30/2013 FINAL   Final   Organism ID, Bacteria PROTEUS MIRABILIS   Final  MRSA PCR SCREENING     Status: None   Collection Time    09/28/13 11:45 AM      Result Value Ref Range Status   MRSA by PCR NEGATIVE  NEGATIVE Final   Comment:            The GeneXpert MRSA Assay (FDA     approved for NASAL specimens     only), is one component of a     comprehensive MRSA colonization     surveillance program. It is not     intended to diagnose MRSA     infection nor to guide or     monitor treatment for     MRSA infections.  CLOSTRIDIUM DIFFICILE BY PCR     Status: None   Collection Time    10/01/13  9:45 AM      Result Value Ref Range Status   C difficile by pcr NEGATIVE  NEGATIVE Final     Labs: Basic Metabolic Panel:  Recent Labs Lab 09/27/13 2242 09/28/13 0609 09/29/13 0511 10/01/13 0646  NA 136* 138 139 142  K 3.7 3.5* 3.8 3.8  CL 97 102 103 104  CO2 GLUCOSE 298* 244* 149* 99  BUN CREATININE 0.64 0.48* 0.51 0.49*  CALCIUM 9.1 8.6 8.6 8.8   Liver Function Tests:  Recent Labs Lab 09/27/13 2310 09/28/13 0609  AST 27 25  ALT 28 24  ALKPHOS 129* 116  BILITOT 0.6 0.5  PROT 6.8 6.1  ALBUMIN 3.3* 2.8*   No results found for this basename: LIPASE, AMYLASE,  in the last 168 hours No results found for this basename: AMMONIA,  in the last 168 hours CBC:  Recent Labs Lab 09/27/13 2242 09/28/13 0609 09/29/13 0511 10/01/13 0646  WBC 9.9 7.5 6.6 5.6  NEUTROABS 8.7*  --   --   --   HGB 12.6 11.7* 11.7* 12.4  HCT 37.2 34.5* 34.5* 36.2  MCV 89.4 89.1 89.1 88.3  PLT 182 180 165 181   Cardiac Enzymes: No results found for this basename: CKTOTAL, CKMB, CKMBINDEX, TROPONINI,  in the last 168  hours BNP: BNP (last 3 results) No results found for this basename: PROBNP,  in the last 8760 hours CBG:  Recent Labs Lab 09/30/13 1130 09/30/13 1636 09/30/13 2051 10/01/13 0752 10/01/13 1225  GLUCAP 216* 110* 137* 127* 150*       Signed:  Riki Berninger  [facility] 10/01/2013, 2:19 PM

## 2013-10-01 NOTE — Progress Notes (Signed)
Report called to Yolanda at [facility].  Patient transported by EMS to [facility].  IV to right hand WNL at time of discharge.  Facility to use IV for antibiotics.  Patient stable at time of discharge.

## 2013-10-02 LAB — CULTURE, BLOOD (ROUTINE X 2): Culture: NO GROWTH

## 2013-10-02 NOTE — Progress Notes (Signed)
UR chart review completed.  

## 2013-10-16 ENCOUNTER — Ambulatory Visit (INDEPENDENT_AMBULATORY_CARE_PROVIDER_SITE_OTHER): Payer: Medicare FFS | Admitting: Urology

## 2013-10-16 DIAGNOSIS — N3020 Other chronic cystitis without hematuria: Secondary | ICD-10-CM

## 2013-10-16 DIAGNOSIS — N952 Postmenopausal atrophic vaginitis: Secondary | ICD-10-CM

## 2013-10-16 DIAGNOSIS — N8111 Cystocele, midline: Secondary | ICD-10-CM

## 2013-10-23 ENCOUNTER — Other Ambulatory Visit: Payer: Self-pay | Admitting: Urology

## 2013-10-23 DIAGNOSIS — N200 Calculus of kidney: Secondary | ICD-10-CM

## 2013-11-21 ENCOUNTER — Ambulatory Visit (HOSPITAL_COMMUNITY)
Admission: RE | Admit: 2013-11-21 | Discharge: 2013-11-21 | Disposition: A | Discharge status: Home / Self Care | Payer: Medicare FFS | Source: Ambulatory Visit | Attending: Urology | Admitting: Urology

## 2013-11-21 ENCOUNTER — Other Ambulatory Visit: Payer: Self-pay | Admitting: Urology

## 2013-11-21 DIAGNOSIS — N200 Calculus of kidney: Secondary | ICD-10-CM | POA: Diagnosis present

## 2013-11-30 ENCOUNTER — Ambulatory Visit (INDEPENDENT_AMBULATORY_CARE_PROVIDER_SITE_OTHER): Payer: Medicare FFS | Admitting: Urology

## 2013-11-30 DIAGNOSIS — N200 Calculus of kidney: Secondary | ICD-10-CM

## 2013-11-30 DIAGNOSIS — N3020 Other chronic cystitis without hematuria: Secondary | ICD-10-CM

## 2014-04-16 ENCOUNTER — Ambulatory Visit (INDEPENDENT_AMBULATORY_CARE_PROVIDER_SITE_OTHER): Payer: Medicare FFS | Admitting: Urology

## 2014-04-16 DIAGNOSIS — N814 Uterovaginal prolapse, unspecified: Secondary | ICD-10-CM | POA: Diagnosis not present

## 2014-07-16 ENCOUNTER — Ambulatory Visit (INDEPENDENT_AMBULATORY_CARE_PROVIDER_SITE_OTHER): Payer: Medicare FFS | Admitting: Urology

## 2014-07-16 DIAGNOSIS — N8110 Cystocele, unspecified: Secondary | ICD-10-CM | POA: Diagnosis not present

## 2014-08-12 ENCOUNTER — Other Ambulatory Visit (HOSPITAL_COMMUNITY): Payer: Self-pay | Admitting: Internal Medicine

## 2014-08-12 DIAGNOSIS — R4182 Altered mental status, unspecified: Secondary | ICD-10-CM

## 2014-08-16 ENCOUNTER — Ambulatory Visit (HOSPITAL_COMMUNITY): Payer: Medicare FFS

## 2014-08-19 ENCOUNTER — Ambulatory Visit (HOSPITAL_COMMUNITY)
Admission: RE | Admit: 2014-08-19 | Discharge: 2014-08-19 | Disposition: A | Discharge status: Home / Self Care | Payer: Medicare FFS | Source: Ambulatory Visit | Attending: Internal Medicine | Admitting: Internal Medicine

## 2014-08-19 DIAGNOSIS — Z8673 Personal history of transient ischemic attack (TIA), and cerebral infarction without residual deficits: Secondary | ICD-10-CM | POA: Diagnosis not present

## 2014-08-19 DIAGNOSIS — R4182 Altered mental status, unspecified: Secondary | ICD-10-CM

## 2014-08-19 DIAGNOSIS — I10 Essential (primary) hypertension: Secondary | ICD-10-CM | POA: Diagnosis not present

## 2014-08-19 DIAGNOSIS — E119 Type 2 diabetes mellitus without complications: Secondary | ICD-10-CM | POA: Diagnosis not present

## 2014-08-19 DIAGNOSIS — R9089 Other abnormal findings on diagnostic imaging of central nervous system: Secondary | ICD-10-CM | POA: Insufficient documentation

## 2014-11-19 ENCOUNTER — Ambulatory Visit (INDEPENDENT_AMBULATORY_CARE_PROVIDER_SITE_OTHER): Payer: Medicare FFS | Admitting: Urology

## 2014-11-19 DIAGNOSIS — N952 Postmenopausal atrophic vaginitis: Secondary | ICD-10-CM | POA: Diagnosis not present

## 2014-11-19 DIAGNOSIS — N3020 Other chronic cystitis without hematuria: Secondary | ICD-10-CM

## 2014-11-19 DIAGNOSIS — N8111 Cystocele, midline: Secondary | ICD-10-CM

## 2014-11-27 ENCOUNTER — Other Ambulatory Visit: Payer: Self-pay | Admitting: Urology

## 2014-11-27 DIAGNOSIS — N200 Calculus of kidney: Secondary | ICD-10-CM

## 2014-11-27 DIAGNOSIS — N3020 Other chronic cystitis without hematuria: Secondary | ICD-10-CM

## 2014-11-29 ENCOUNTER — Ambulatory Visit (INDEPENDENT_AMBULATORY_CARE_PROVIDER_SITE_OTHER): Payer: Medicare FFS | Admitting: Urology

## 2014-11-29 DIAGNOSIS — N3020 Other chronic cystitis without hematuria: Secondary | ICD-10-CM

## 2014-11-29 DIAGNOSIS — N8110 Cystocele, unspecified: Secondary | ICD-10-CM

## 2014-11-29 DIAGNOSIS — N3941 Urge incontinence: Secondary | ICD-10-CM | POA: Diagnosis not present

## 2015-02-05 ENCOUNTER — Ambulatory Visit (HOSPITAL_COMMUNITY)
Admission: RE | Admit: 2015-02-05 | Discharge: 2015-02-05 | Disposition: A | Discharge status: Home / Self Care | Payer: Medicare FFS | Source: Ambulatory Visit | Attending: Urology | Admitting: Urology

## 2015-02-05 DIAGNOSIS — N200 Calculus of kidney: Secondary | ICD-10-CM | POA: Diagnosis present

## 2015-02-05 DIAGNOSIS — N3020 Other chronic cystitis without hematuria: Secondary | ICD-10-CM | POA: Insufficient documentation

## 2015-02-25 ENCOUNTER — Ambulatory Visit (HOSPITAL_COMMUNITY)
Admission: RE | Admit: 2015-02-25 | Discharge: 2015-02-25 | Disposition: A | Discharge status: Home / Self Care | Payer: Medicare FFS | Source: Ambulatory Visit | Attending: Urology | Admitting: Urology

## 2015-02-25 ENCOUNTER — Other Ambulatory Visit: Payer: Self-pay | Admitting: Urology

## 2015-02-25 DIAGNOSIS — N200 Calculus of kidney: Secondary | ICD-10-CM

## 2015-03-04 ENCOUNTER — Ambulatory Visit (INDEPENDENT_AMBULATORY_CARE_PROVIDER_SITE_OTHER): Payer: Medicare FFS | Admitting: Urology

## 2015-03-04 DIAGNOSIS — N8111 Cystocele, midline: Secondary | ICD-10-CM

## 2015-03-04 DIAGNOSIS — N3020 Other chronic cystitis without hematuria: Secondary | ICD-10-CM

## 2015-03-04 DIAGNOSIS — N952 Postmenopausal atrophic vaginitis: Secondary | ICD-10-CM | POA: Diagnosis not present

## 2015-03-24 ENCOUNTER — Encounter (HOSPITAL_COMMUNITY): Payer: Self-pay | Admitting: Cardiology

## 2015-03-24 ENCOUNTER — Emergency Department (HOSPITAL_COMMUNITY): Payer: Medicare FFS

## 2015-03-24 ENCOUNTER — Emergency Department (HOSPITAL_COMMUNITY)
Admission: EM | Admit: 2015-03-24 | Discharge: 2015-03-25 | Disposition: A | Discharge status: Home / Self Care | Payer: Medicare FFS | Attending: Emergency Medicine | Admitting: Emergency Medicine

## 2015-03-24 DIAGNOSIS — Z794 Long term (current) use of insulin: Secondary | ICD-10-CM | POA: Insufficient documentation

## 2015-03-24 DIAGNOSIS — Z959 Presence of cardiac and vascular implant and graft, unspecified: Secondary | ICD-10-CM | POA: Insufficient documentation

## 2015-03-24 DIAGNOSIS — Z8673 Personal history of transient ischemic attack (TIA), and cerebral infarction without residual deficits: Secondary | ICD-10-CM | POA: Diagnosis not present

## 2015-03-24 DIAGNOSIS — Z87442 Personal history of urinary calculi: Secondary | ICD-10-CM | POA: Diagnosis not present

## 2015-03-24 DIAGNOSIS — R627 Adult failure to thrive: Secondary | ICD-10-CM | POA: Diagnosis present

## 2015-03-24 DIAGNOSIS — Z95828 Presence of other vascular implants and grafts: Secondary | ICD-10-CM

## 2015-03-24 DIAGNOSIS — I10 Essential (primary) hypertension: Secondary | ICD-10-CM | POA: Diagnosis not present

## 2015-03-24 DIAGNOSIS — Z79899 Other long term (current) drug therapy: Secondary | ICD-10-CM | POA: Diagnosis not present

## 2015-03-24 DIAGNOSIS — R630 Anorexia: Secondary | ICD-10-CM | POA: Diagnosis not present

## 2015-03-24 DIAGNOSIS — Z7984 Long term (current) use of oral hypoglycemic drugs: Secondary | ICD-10-CM | POA: Insufficient documentation

## 2015-03-24 DIAGNOSIS — E119 Type 2 diabetes mellitus without complications: Secondary | ICD-10-CM | POA: Diagnosis not present

## 2015-03-24 HISTORY — DX: Anxiety disorder, unspecified: F41.9

## 2015-03-24 HISTORY — DX: Transient cerebral ischemic attack, unspecified: G45.9

## 2015-03-24 HISTORY — DX: Enterocolitis due to Clostridium difficile, not specified as recurrent: A04.72

## 2015-03-24 LAB — COMPREHENSIVE METABOLIC PANEL
ALK PHOS: 104 U/L (ref 38–126)
ALT: 17 U/L (ref 14–54)
ANION GAP: 9 (ref 5–15)
AST: 30 U/L (ref 15–41)
Albumin: 2.9 g/dL — ABNORMAL LOW (ref 3.5–5.0)
BILIRUBIN TOTAL: 0.7 mg/dL (ref 0.3–1.2)
BUN: 19 mg/dL (ref 6–20)
CO2: 21 mmol/L — AB (ref 22–32)
CREATININE: 0.7 mg/dL (ref 0.44–1.00)
Calcium: 8.1 mg/dL — ABNORMAL LOW (ref 8.9–10.3)
Chloride: 108 mmol/L (ref 101–111)
GFR calc non Af Amer: >60 mL/min (ref >60)
GLUCOSE: 138 mg/dL — AB (ref 65–99)
POTASSIUM: 4.2 mmol/L (ref 3.5–5.1)
SODIUM: 138 mmol/L (ref 135–145)
Total Protein: 5.8 g/dL — ABNORMAL LOW (ref 6.5–8.1)

## 2015-03-24 LAB — URINALYSIS, ROUTINE W REFLEX MICROSCOPIC
BILIRUBIN URINE: NEGATIVE
GLUCOSE, UA: NEGATIVE mg/dL
Hgb urine dipstick: NEGATIVE
Ketones, ur: 40 mg/dL — AB
Leukocytes, UA: NEGATIVE
Nitrite: NEGATIVE
PH: 5.5 (ref 5.0–8.0)
Protein, ur: NEGATIVE mg/dL
SPECIFIC GRAVITY, URINE: 1.025 (ref 1.005–1.030)

## 2015-03-24 LAB — CBC WITH DIFFERENTIAL/PLATELET
BASOS PCT: 1 %
Basophils Absolute: 0.1 10*3/uL (ref 0.0–0.1)
Eosinophils Absolute: 0.2 10*3/uL (ref 0.0–0.7)
Eosinophils Relative: 2 %
HEMATOCRIT: 41.3 % (ref 36.0–46.0)
HEMOGLOBIN: 13.7 g/dL (ref 12.0–15.0)
LYMPHS ABS: 1.5 10*3/uL (ref 0.7–4.0)
LYMPHS PCT: 14 %
MCH: 31 pg (ref 26.0–34.0)
MCHC: 33.2 g/dL (ref 30.0–36.0)
MCV: 93.4 fL (ref 78.0–100.0)
MONOS PCT: 3 %
Monocytes Absolute: 0.4 10*3/uL (ref 0.1–1.0)
NEUTROS ABS: 8.9 10*3/uL — AB (ref 1.7–7.7)
NEUTROS PCT: 80 %
Platelets: 252 10*3/uL (ref 150–400)
RBC: 4.42 MIL/uL (ref 3.87–5.11)
RDW: 14.1 % (ref 11.5–15.5)
WBC: 11 10*3/uL — ABNORMAL HIGH (ref 4.0–10.5)

## 2015-03-24 LAB — TROPONIN I: Troponin I: <0.03 ng/mL (ref <0.031)

## 2015-03-24 LAB — LIPASE, BLOOD: Lipase: 25 U/L (ref 11–51)

## 2015-03-24 LAB — LACTIC ACID, PLASMA: Lactic Acid, Venous: 1 mmol/L (ref 0.5–2.0)

## 2015-03-24 NOTE — ED Notes (Addendum)
From [facility].  Pt has not eat or drink in days.  Currently being treated for c difficile with  flagyl.  Staff unable to get IV Access and sent here for PICC line placement.

## 2015-03-24 NOTE — ED Provider Notes (Signed)
CSN: [id number]     Arrival date & time 03/24/15  1733 History   First MD Initiated Contact with Patient 03/24/15 1740     Chief Complaint  Patient presents with  . Vascular Access Problem  . Failure To Thrive      HPI Pt was seen at 1745. Per NH report and pt: c/o gradual onset and persistence of constant decreased PO intake for the past several days. Pt is currently being tx for cdiff with flagyl. NH staff was unable to obtain IV access, so they sent pt to the ED for PICC placement. Pt herself denies any complaints. Denies CP/SOB, no abd pain, no N/V, no fevers, no back pain, no rash.    Past Medical History  Diagnosis Date  . Diabetes mellitus without complication (HCC)   . Arthritis   . Hypertension   . UTI (urinary tract infection)   . Renal stone     left  . Gram-negative bacteremia (HCC) 09/30/2013  . UTI (lower urinary tract infection) 07/18/2012  . C. difficile diarrhea   . TIA (transient ischemic attack)   . Anxiety    Past Surgical History  Procedure Laterality Date  . Tonsillectomy    . Pacemaker insertion      Social History  Substance Use Topics  . Smoking status: Never Smoker   . Smokeless tobacco: None  . Alcohol Use: No    Review of Systems ROS: Statement: All systems negative except as marked or noted in the HPI; Constitutional: Negative for fever and chills. ; ; Eyes: Negative for eye pain, redness and discharge. ; ; ENMT: Negative for ear pain, hoarseness, nasal congestion, sinus pressure and sore throat. ; ; Cardiovascular: Negative for chest pain, palpitations, diaphoresis, dyspnea and peripheral edema. ; ; Respiratory: Negative for cough, wheezing and stridor. ; ; Gastrointestinal: +"poor PO intake." Negative for nausea, vomiting, diarrhea, abdominal pain, blood in stool, hematemesis, jaundice and rectal bleeding. . ; ; Genitourinary: Negative for dysuria, flank pain and hematuria. ; ; Musculoskeletal: Negative for back pain and neck pain. Negative for  swelling and trauma.; ; Skin: Negative for pruritus, rash, abrasions, blisters, bruising and skin lesion.; ; Neuro: Negative for headache, lightheadedness and neck stiffness. Negative for weakness, altered level of consciousness , altered mental status, extremity weakness, paresthesias, involuntary movement, seizure and syncope.      Allergies  Review of patient's allergies indicates no known allergies.  Home Medications   Prior to Admission medications   Medication Sig Start Date End Date Taking? Authorizing Provider  acetaminophen (TYLENOL) 650 MG CR tablet Take 650 mg by mouth 2 (two) times daily.    Historical Provider, MD  aspirin 325 MG tablet Take 1 tablet (325 mg total) by mouth daily. 07/21/12   Nimish Normajean Glasgow, MD  cefTRIAXone 1 g in dextrose 5 % 50 mL Inject 1 g into the vein daily. Give for 6 days, starting tomorrow. 10/01/13   Elliot Cousin, MD  CRANBERRY PO Take by mouth.    Historical Provider, MD  ferrous fumarate (HEMOCYTE - 106 MG FE) 325 (106 FE) MG TABS tablet Take 1 tablet by mouth 2 (two) times daily.    Historical Provider, MD  HYDROcodone-acetaminophen (NORCO/VICODIN) 5-325 MG per tablet Take 1 tablet by mouth every 4 (four) hours as needed for moderate pain.    Historical Provider, MD  insulin aspart (NOVOLOG) 100 UNIT/ML injection Inject 2-8 Units into the skin 3 (three) times daily before meals. SSI with meals and at bedtime.  Historical Provider, MD  insulin detemir (LEVEMIR) 100 UNIT/ML injection Inject 0.15 mLs (15 Units total) into the skin at bedtime. 10/01/13   Elliot Cousin, MD  linezolid (ZYVOX) 600 MG tablet Take 600 mg by mouth 2 (two) times daily. Starting 09/20/2013 x 10 days.    Historical Provider, MD  lisinopril (PRINIVIL,ZESTRIL) 10 MG tablet Take 10 mg by mouth daily.    Historical Provider, MD  LORazepam (ATIVAN) 0.5 MG tablet Take 0.5 mg by mouth 2 (two) times daily.     Historical Provider, MD  metFORMIN (GLUCOPHAGE) 500 MG tablet Take 1,000 mg by  mouth 2 (two) times daily with a meal.    Historical Provider, MD  oxybutynin (DITROPAN XL) 15 MG 24 hr tablet Take 15 mg by mouth every evening.    Historical Provider, MD  oxymetazoline (AFRIN) 0.05 % nasal spray Place 1 spray into both nostrils 2 (two) times daily as needed for congestion. 10/01/13   Elliot Cousin, MD  polyethylene glycol [facility] Ethelene Hal) packet Take 17 g by mouth daily as needed. 10/01/13   Elliot Cousin, MD  senna (SENOKOT) 8.6 MG TABS tablet Take 1 tablet (8.6 mg total) by mouth daily as needed for mild constipation. 10/01/13   Elliot Cousin, MD  sertraline (ZOLOFT) 25 MG tablet Take 25 mg by mouth daily.    Historical Provider, MD  vitamin C (ASCORBIC ACID) 500 MG tablet Take 500 mg by mouth 2 (two) times daily.    Historical Provider, MD   BP 126/58 mmHg  Pulse 66  Temp(Src) 97.9 F (36.6 C) (Oral)  Resp 25  SpO2 98%   18:19 Orthostatic Vital Signs CS  Orthostatic Lying  - BP- Lying: 137/56 mmHg ; Pulse- Lying: 70  Orthostatic Sitting - BP- Sitting: 133/58 mmHg ; Pulse- Sitting: 73      Physical Exam 1750: Physical examination:  Nursing notes reviewed; Vital signs and O2 SAT reviewed;  Constitutional: Well developed, Well nourished, In no acute distress; Head:  Normocephalic, atraumatic; Eyes: EOMI, PERRL, No scleral icterus; ENMT: Mouth and pharynx normal, Mucous membranes dry; Neck: Supple, Full range of motion, No lymphadenopathy; Cardiovascular: Regular rate and rhythm, No gallop; Respiratory: Breath sounds clear & equal bilaterally, No wheezes.  Speaking full sentences with ease, Normal respiratory effort/excursion; Chest: Nontender, Movement normal; Abdomen: Soft, Nontender, Nondistended, Normal bowel sounds; Genitourinary: No CVA tenderness; Extremities: Pulses normal, No tenderness, No edema, No calf edema or asymmetry.; Neuro: AA&Ox3, Major CN grossly intact. No facial droop. Speech clear. Grips equal. Strength 4/5 equal bilat UE's and LE's. No gross focal  sensory deficits in extremities.; Skin: Color normal, Warm, Dry.   ED Course  Procedures (including critical care time) Labs Review  Imaging Review  I have personally reviewed and evaluated these images and lab results as part of my medical decision-making.   EKG Interpretation   Date/Time:  Monday March 24 2015 18:02:00 EDT Ventricular Rate:  65 PR Interval:  182 QRS Duration: 100 QT Interval:  466 QTC Calculation: 485 R Axis:   -25 Text Interpretation:  Sinus rhythm Supraventricular bigeminy Borderline  left axis deviation Low voltage, precordial leads Posterior infarct, old  When compared with ECG of 07/18/2012 Premature ventricular complexes are now  Present Confirmed by [facility]  MD, Nicholos Johns [phone]) on 03/24/2015 6:20:39 PM      MDM  MDM Reviewed: previous chart, nursing note and vitals Reviewed previous: labs and ECG Interpretation: labs, ECG and x-ray     Results for orders placed or performed during  the hospital encounter of 03/24/15  Comprehensive metabolic panel  Result Value Ref Range   Sodium 138 135 - 145 mmol/L   Potassium 4.2 3.5 - 5.1 mmol/L   Chloride 108 101 - 111 mmol/L   CO2 21 (L) 22 - 32 mmol/L   Glucose, Bld 138 (H) 65 - 99 mg/dL   BUN 19 6 - 20 mg/dL   Creatinine, Ser 5.62 0.44 - 1.00 mg/dL   Calcium 8.1 (L) 8.9 - 10.3 mg/dL   Total Protein 5.8 (L) 6.5 - 8.1 g/dL   Albumin 2.9 (L) 3.5 - 5.0 g/dL   AST 30 15 - 41 U/L   ALT 17 14 - 54 U/L   Alkaline Phosphatase 104 38 - 126 U/L   Total Bilirubin 0.7 0.3 - 1.2 mg/dL   GFR calc non Af Amer >60 >60 mL/min   GFR calc Af Amer >60 >60 mL/min   Anion gap 9 5 - 15  Troponin I  Result Value Ref Range   Troponin I <0.03 <0.031 ng/mL  Lactic acid, plasma  Result Value Ref Range   Lactic Acid, Venous 1.0 0.5 - 2.0 mmol/L  CBC with Differential  Result Value Ref Range   WBC 11.0 (H) 4.0 - 10.5 K/uL   RBC 4.42 3.87 - 5.11 MIL/uL   Hemoglobin 13.7 12.0 - 15.0 g/dL   HCT 13.0 86.5 - 78.4 %   MCV  93.4 78.0 - 100.0 fL   MCH 31.0 26.0 - 34.0 pg   MCHC 33.2 30.0 - 36.0 g/dL   RDW 69.6 29.5 - 28.4 %   Platelets 252 150 - 400 K/uL   Neutrophils Relative % 80 %   Neutro Abs 8.9 (H) 1.7 - 7.7 K/uL   Lymphocytes Relative 14 %   Lymphs Abs 1.5 0.7 - 4.0 K/uL   Monocytes Relative 3 %   Monocytes Absolute 0.4 0.1 - 1.0 K/uL   Eosinophils Relative 2 %   Eosinophils Absolute 0.2 0.0 - 0.7 K/uL   Basophils Relative 1 %   Basophils Absolute 0.1 0.0 - 0.1 K/uL  Urinalysis, Routine w reflex microscopic  Result Value Ref Range   Color, Urine AMBER (A) YELLOW   APPearance CLEAR CLEAR   Specific Gravity, Urine 1.025 1.005 - 1.030   pH 5.5 5.0 - 8.0   Glucose, UA NEGATIVE NEGATIVE mg/dL   Hgb urine dipstick NEGATIVE NEGATIVE   Bilirubin Urine NEGATIVE NEGATIVE   Ketones, ur 40 (A) NEGATIVE mg/dL   Protein, ur NEGATIVE NEGATIVE mg/dL   Nitrite NEGATIVE NEGATIVE   Leukocytes, UA NEGATIVE NEGATIVE  Lipase, blood  Result Value Ref Range   Lipase 25 11 - 51 U/L   Dg Chest 2 View 03/24/2015  CLINICAL DATA:  Weakness EXAM: CHEST  2 VIEW COMPARISON:  09/27/2013 FINDINGS: The heart is upper normal in size. Lungs are under aerated with bibasilar atelectasis. Upper lungs are clear. Normal vascularity. No pneumothorax or pleural effusion. IMPRESSION: Bibasilar atelectasis. Electronically Signed   By: Jolaine Click M.D.   On: 03/24/2015 19:13    2010:    No clear indication for admission at this time. T/C to [facility]. Sharl Ma, case discussed, including:  HPI, pertinent PM/SHx, VS/PE, dx testing, ED course and treatment:  Agrees that there does not appear to be indication for admission at this time, of note: flagyl can cause metallic taste in mouth and decreased appetite, can suggest to NH to change to vancomycin. Will have vasc team place PICC, per NH request, and  d/c back to NH: stable.  Samuel JesterKathleen Analyce Tavares, DO 03/28/15 1728

## 2015-03-24 NOTE — ED Notes (Signed)
Called [facility], aware of order, will place PICC prior to d/c back to facility.

## 2015-03-24 NOTE — Discharge Instructions (Signed)
Take your usual prescriptions as previously directed. PICC has been placed per request. Of note: flagyl can cause a metallic taste in mouth and decrease appetite; consider changing to vancomycin. Call your regular medical doctor tomorrow morning to schedule a follow up appointment tomorrow  Return to the Emergency Department immediately sooner if worsening.

## 2015-03-24 NOTE — ED Notes (Signed)
[facility] rn at the bedside

## 2015-03-28 LAB — URINE CULTURE: Culture: >=100000

## 2015-03-29 ENCOUNTER — Telehealth (HOSPITAL_BASED_OUTPATIENT_CLINIC_OR_DEPARTMENT_OTHER): Payer: Self-pay | Admitting: Emergency Medicine

## 2015-03-29 NOTE — Progress Notes (Signed)
ED Antimicrobial Stewardship Positive Culture Follow Up   Nicole HainesJanet Williamson is an 82 y.o. female [facility] with a chief complaint of  Chief Complaint  Patient presents with  . Vascular Access Problem  . Failure To Thrive    Recent Results (from the past 720 hour(s))  Urine culture     Status: None   Collection Time: 03/24/15  6:15 PM  Result Value Ref Range Status   Specimen Description URINE, CATHETERIZED  Final   Special Requests NONE  Final   Culture   Final    >=100,000 COLONIES/mL VANCOMYCIN RESISTANT ENTEROCOCCUS [facility] [place]    Report Status 03/28/2015 FINAL  Final   Organism ID, Bacteria VANCOMYCIN RESISTANT ENTEROCOCCUS  Final      Susceptibility   Vancomycin resistant enterococcus - MIC*    AMPICILLIN <=2 SENSITIVE Sensitive     LEVOFLOXACIN >=8 RESISTANT Resistant     NITROFURANTOIN <=16 SENSITIVE Sensitive     VANCOMYCIN >=32 RESISTANT Resistant     LINEZOLID 2 SENSITIVE Sensitive     * >=100,000 COLONIES/mL VANCOMYCIN RESISTANT ENTEROCOCCUS   No urinary symptoms no abx indicated  ED Provider: Noelle PennerSerena Sam, Langston MaskerPA-C  Happy Begeman A Marabeth Melland 03/29/2015, 9:20 AM Infectious Diseases Pharmacist Phone# [phone]

## 2015-03-29 NOTE — Telephone Encounter (Signed)
Post ED Visit - Positive Culture Follow-up  Culture report reviewed by antimicrobial stewardship pharmacist:  []  Enzo BiNathan Batchelder, Pharm.D. []  Celedonio MiyamotoJeremy Frens, Pharm.D., BCPS [x]  Garvin FilaMike Maccia, Pharm.D. []  Georgina PillionElizabeth Martin, Pharm.D., BCPS []  VolantMinh Pham, [address]Pharm[address]D., BCPS, AAHIVP []  Estella HuskMichelle Turner, Pharm.D., BCPS, AAHIVP []  Tennis Mustassie Stewart, Pharm.D. []  Rob Oswaldo DoneVincent, [address]Pharm[address]  Positive urine culture VRE Treated with none,asymptomatic, no further followup needed  Berle MullMiller, Keri Tavella 03/29/2015, 9:42 AM

## 2015-07-08 ENCOUNTER — Ambulatory Visit: Payer: Medicare FFS | Admitting: Urology

## 2016-11-15 IMAGING — DX DG CHEST 2V
2 series · 2 of 2 positions shown · non-contrast
Comparison: 09/27/2013

CLINICAL DATA: Weakness

EXAM:
CHEST  2 VIEW

[chest pa]
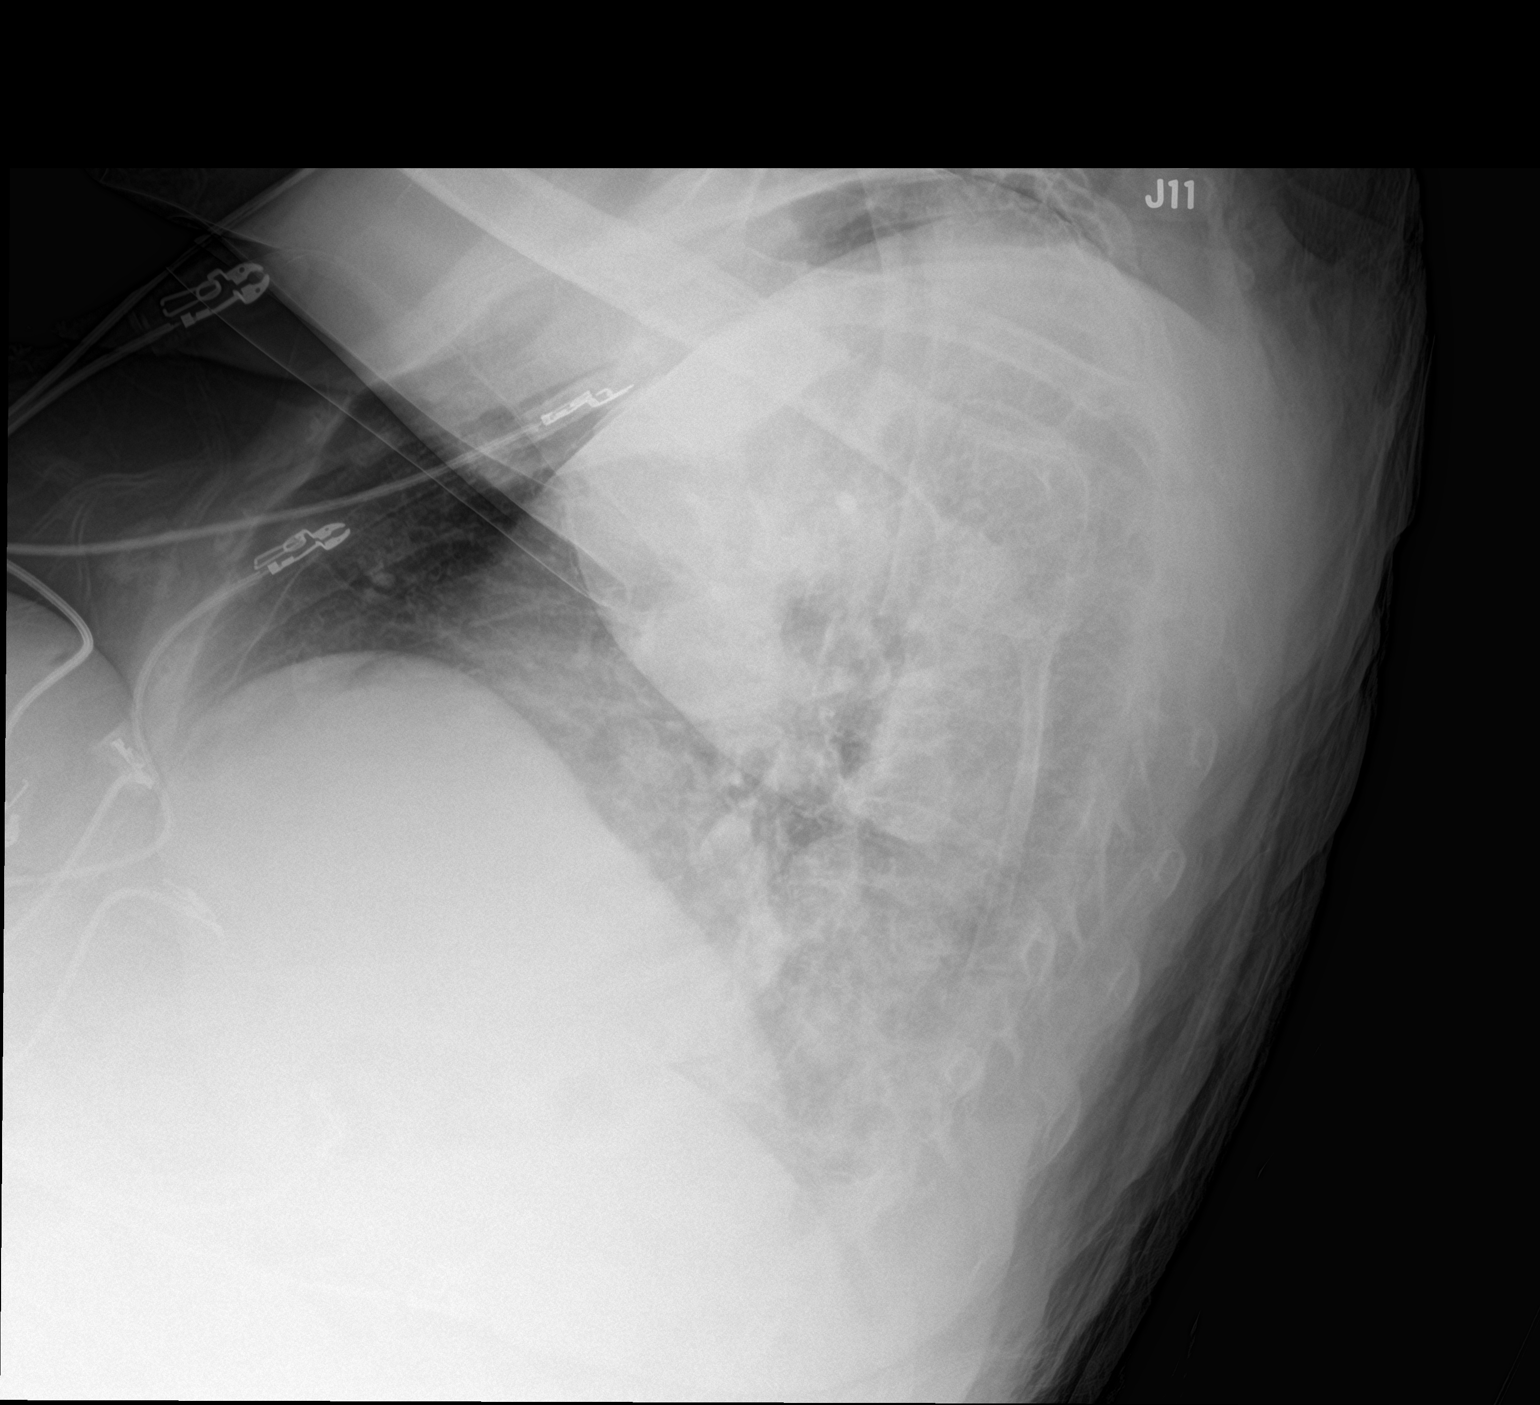

[chest ap]
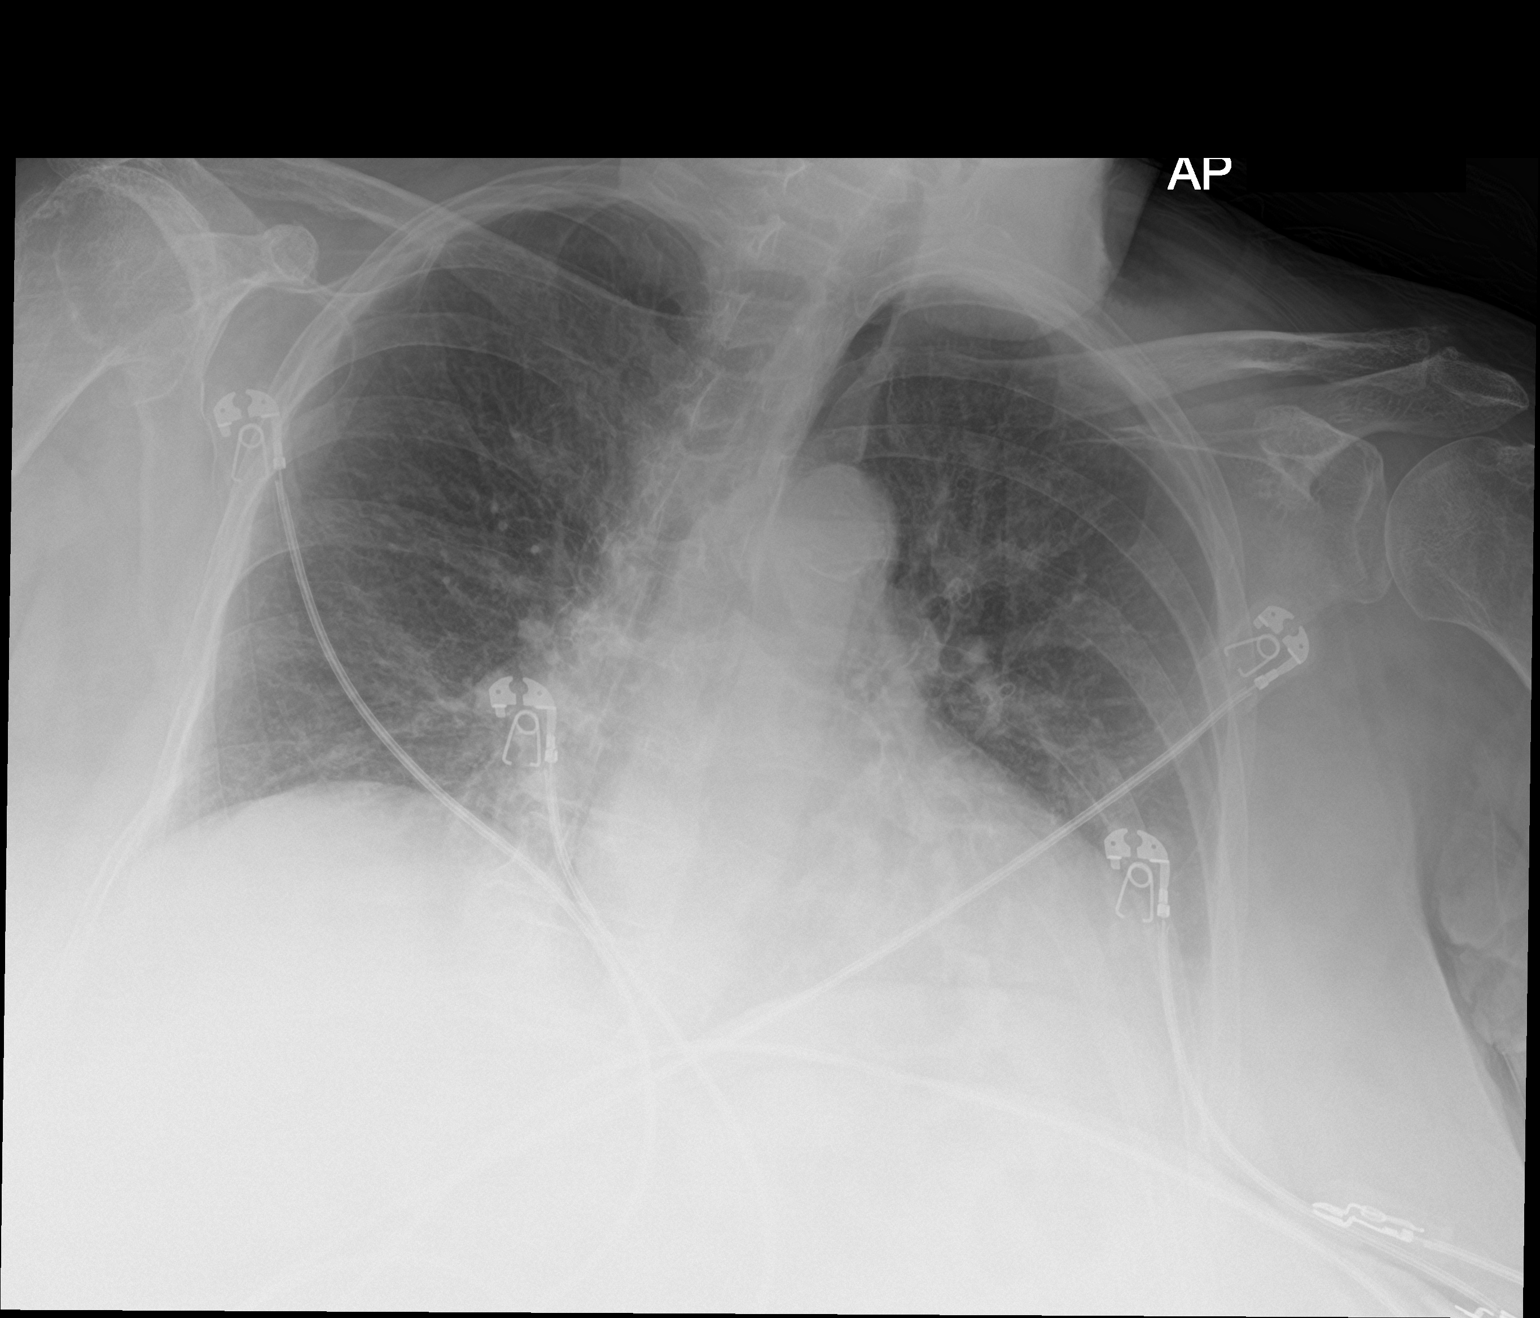

[2 of 2 positions shown; findings below may reference images not displayed]

FINDINGS: The heart is upper normal in size. Lungs are under aerated with
bibasilar atelectasis. Upper lungs are clear. Normal vascularity. No
pneumothorax or pleural effusion.
IMPRESSION: Bibasilar atelectasis.

## 2017-08-11 DEATH — deceased
